# Patient Record
Sex: Male | Born: 1970 | Race: White | Hispanic: No | Smoking: Current every day smoker
Health system: Southern US, Community
[De-identification: ages and names within clinical notes are randomized; demographics above are authoritative.]

## PROBLEM LIST (undated history)

## (undated) ENCOUNTER — Emergency Department: Admission: EM | Payer: MEDICAID | Source: Home / Self Care

## (undated) DIAGNOSIS — D696 Thrombocytopenia, unspecified: Secondary | ICD-10-CM

## (undated) DIAGNOSIS — F1010 Alcohol abuse, uncomplicated: Secondary | ICD-10-CM

## (undated) DIAGNOSIS — K760 Fatty (change of) liver, not elsewhere classified: Secondary | ICD-10-CM

## (undated) DIAGNOSIS — R569 Unspecified convulsions: Secondary | ICD-10-CM

## (undated) DIAGNOSIS — F10939 Alcohol use, unspecified with withdrawal, unspecified: Secondary | ICD-10-CM

## (undated) DIAGNOSIS — K709 Alcoholic liver disease, unspecified: Secondary | ICD-10-CM

## (undated) DIAGNOSIS — I10 Essential (primary) hypertension: Secondary | ICD-10-CM

## (undated) DIAGNOSIS — F32A Depression, unspecified: Secondary | ICD-10-CM

## (undated) DIAGNOSIS — K4090 Unilateral inguinal hernia, without obstruction or gangrene, not specified as recurrent: Secondary | ICD-10-CM

## (undated) DIAGNOSIS — K219 Gastro-esophageal reflux disease without esophagitis: Secondary | ICD-10-CM

## (undated) DIAGNOSIS — F419 Anxiety disorder, unspecified: Secondary | ICD-10-CM

## (undated) HISTORY — DX: Anxiety disorder, unspecified: F41.9

## (undated) HISTORY — DX: Depression, unspecified: F32.A

## (undated) HISTORY — DX: Essential (primary) hypertension: I10

## (undated) HISTORY — DX: Gastro-esophageal reflux disease without esophagitis: K21.9

---

## 2006-06-16 ENCOUNTER — Encounter: Admission: RE | Admit: 2006-06-16 | Discharge: 2006-06-16 | Payer: Self-pay | Admitting: Neurology

## 2006-07-23 IMAGING — CR DG CHEST 2V
2 series · 2 of 2 positions shown · non-contrast
Comparison: none

CLINICAL DATA: Wheezing, cough, smoking history.  
 CHEST X-RAY: 
 Two views of the chest show no pneumonia.  There is some peribronchial thickening which may indicate bronchitis.  The heart is within normal limits in size.

[view not recorded (1 of 2)]
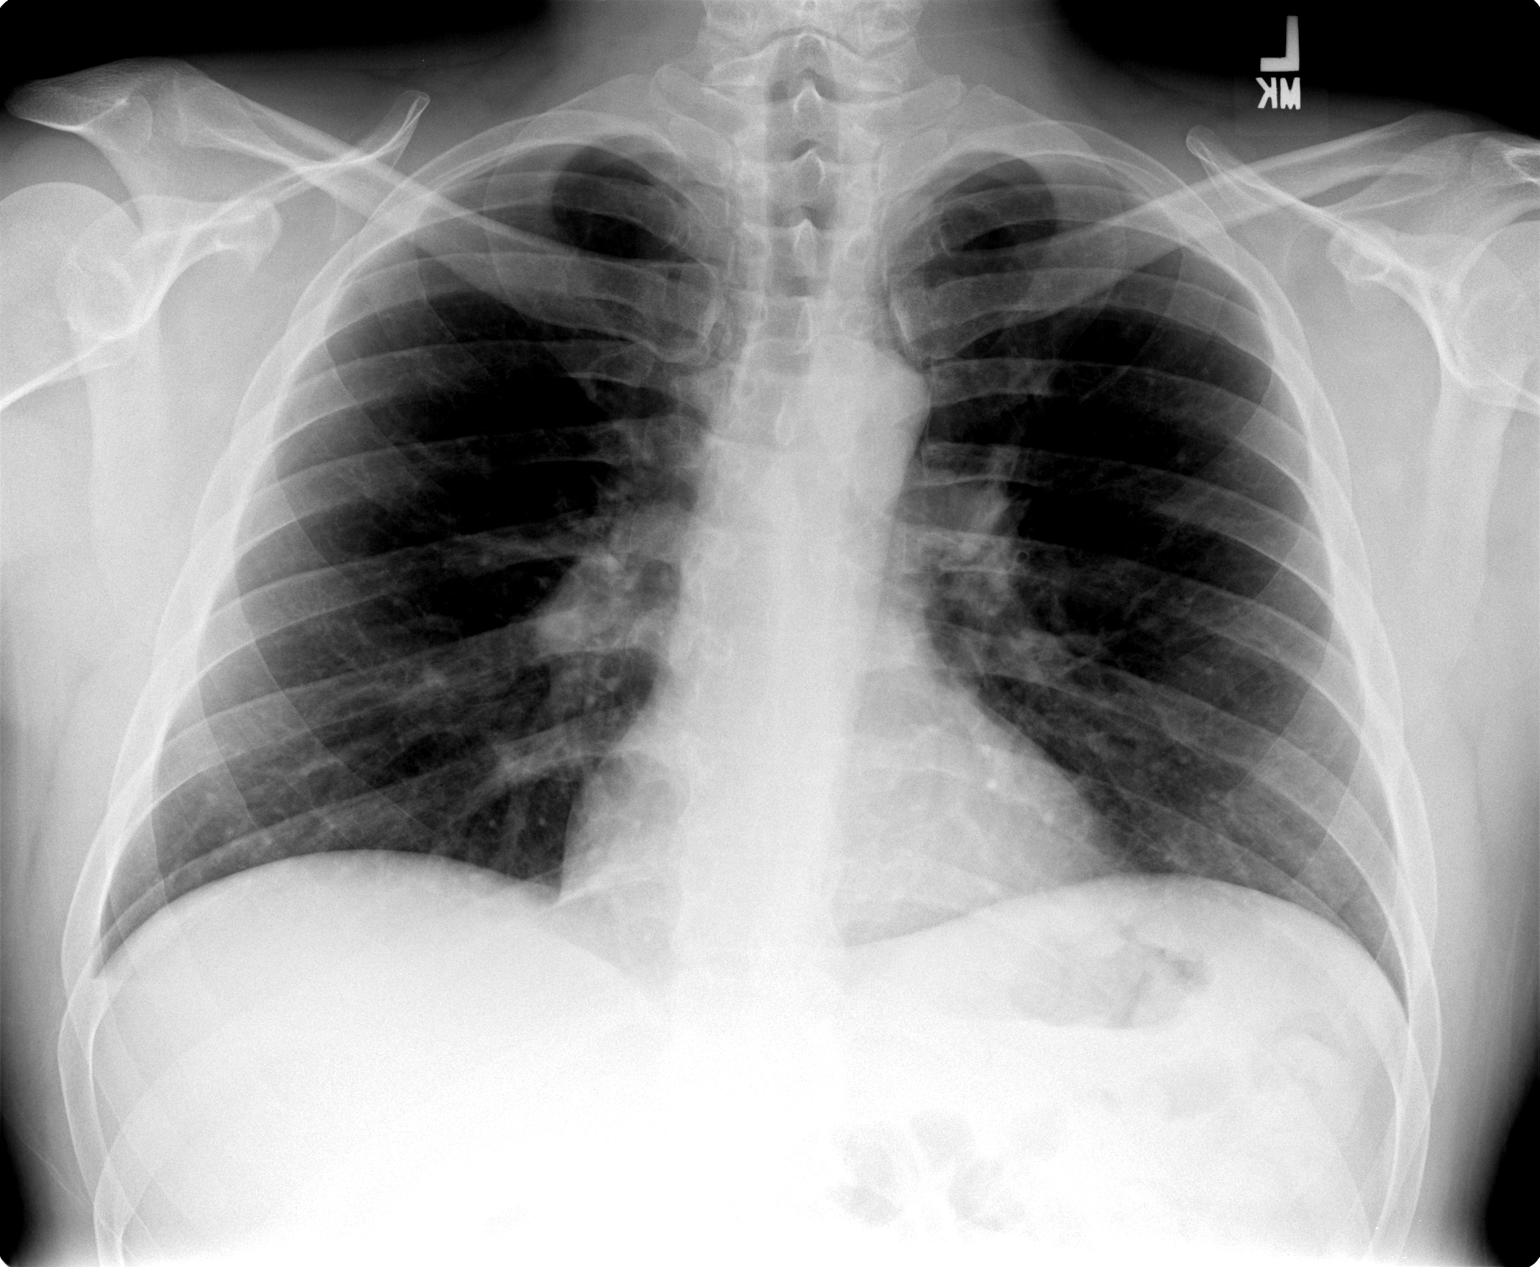

[view not recorded (2 of 2)]
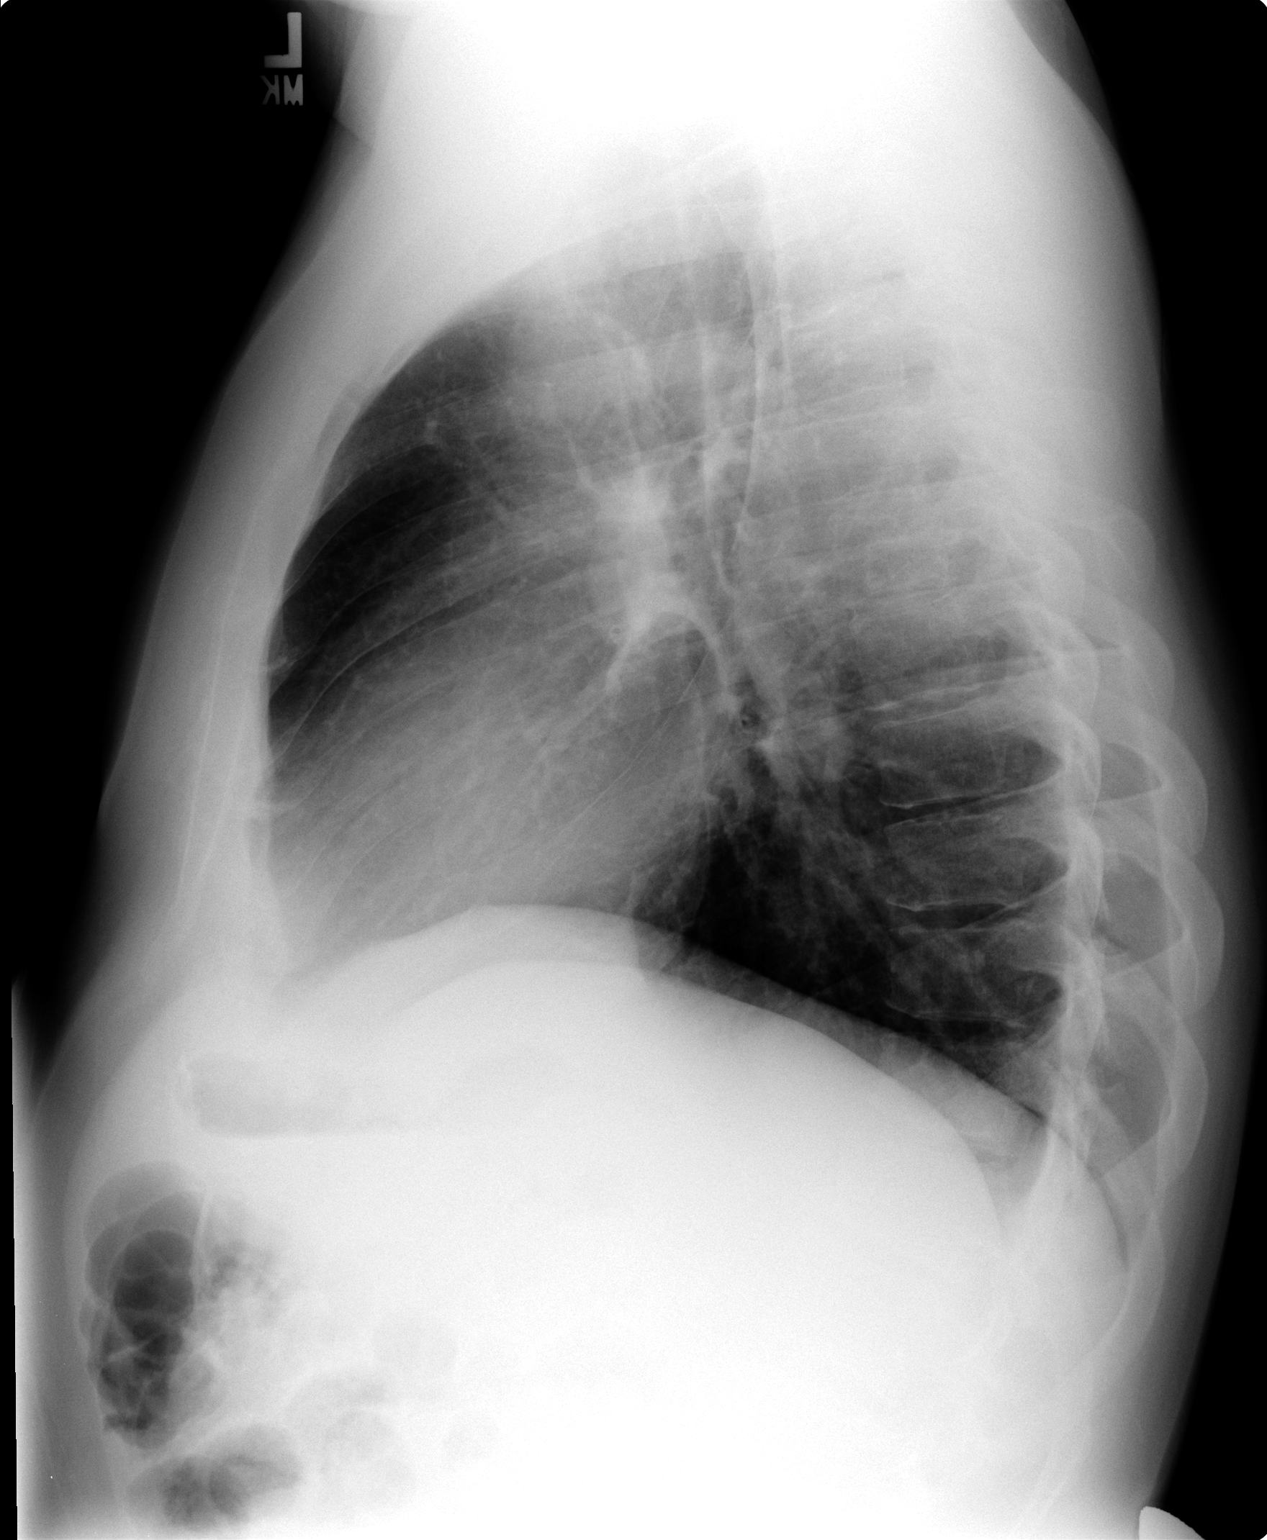

[2 of 2 positions shown; findings below may reference images not displayed]

IMPRESSION: No pneumonia.  Question bronchitis.

## 2017-03-24 HISTORY — PX: UPPER GASTROINTESTINAL ENDOSCOPY: SHX188

## 2018-12-02 HISTORY — PX: GALLBLADDER SURGERY: SHX652

## 2020-08-15 ENCOUNTER — Ambulatory Visit: Payer: Self-pay | Admitting: Gerontology

## 2020-08-15 ENCOUNTER — Encounter: Payer: Self-pay | Admitting: Gerontology

## 2020-08-15 ENCOUNTER — Other Ambulatory Visit: Payer: Self-pay

## 2020-08-15 VITALS — BP 104/71 | HR 89 | Wt 204.4 lb

## 2020-08-15 DIAGNOSIS — Z8659 Personal history of other mental and behavioral disorders: Secondary | ICD-10-CM

## 2020-08-15 DIAGNOSIS — IMO0001 Reserved for inherently not codable concepts without codable children: Secondary | ICD-10-CM

## 2020-08-15 DIAGNOSIS — Z7689 Persons encountering health services in other specified circumstances: Secondary | ICD-10-CM

## 2020-08-15 DIAGNOSIS — R1011 Right upper quadrant pain: Secondary | ICD-10-CM

## 2020-08-15 DIAGNOSIS — R109 Unspecified abdominal pain: Secondary | ICD-10-CM | POA: Insufficient documentation

## 2020-08-15 DIAGNOSIS — F17200 Nicotine dependence, unspecified, uncomplicated: Secondary | ICD-10-CM | POA: Insufficient documentation

## 2020-08-15 DIAGNOSIS — Z8719 Personal history of other diseases of the digestive system: Secondary | ICD-10-CM

## 2020-08-15 DIAGNOSIS — I10 Essential (primary) hypertension: Secondary | ICD-10-CM

## 2020-08-15 MED ORDER — SERTRALINE HCL 50 MG PO TABS: 50.0000 mg | ORAL_TABLET | Freq: Every morning | ORAL | 0 refills | Status: DC

## 2020-08-15 MED ORDER — OMEPRAZOLE 20 MG PO CPDR: 20.0000 mg | DELAYED_RELEASE_CAPSULE | Freq: Every day | ORAL | 3 refills | Status: DC

## 2020-08-15 MED ORDER — AMLODIPINE BESYLATE 5 MG PO TABS: 5.0000 mg | ORAL_TABLET | Freq: Every day | ORAL | 2 refills | Status: DC

## 2020-08-15 NOTE — Progress Notes (Signed)
Patient ID: Maurice Murray, male   DOB: 07/21/1971, 49 y.o.   MRN: [id number]  Chief Complaint  Patient presents with  . Establish Care  . Abdominal Pain    Felt after lifting in RUQ and moves lower about 1 month     HPI GARRETH Murray is a 49 y.o. male who presents to establish care and evaluation of his chronic conditions. He resides at [facility] for rehabilitation. He states that his last alcoholic beverage consumption was 07/19/2020. Currently he c/o intermittent right upper quadrant abdominal  pain that radiates to his lower abdominal quadrant. He states that it started 1-2 months ago and it worsens after he stopped drinking alcoholic beverages. He describes pain as sharp 6/10 and it lasts for less than 5 minutes before it resolves. He states that lifting objects aggravates pain.  He states that sitting down and resting relieves symptoms. He also reports that he feels a knot in his umbilical area when he lifts objects and this has been going on since his gall bladder surgery in 2019 . He also has a history of GERD and he takes otc 20 mg Omeprazole with relief. He equally reports of having a history of Anxiety and Depression and has been taking Sertraline 50 mg every month for 4 years and he has 3 tablets left. He states that his mood is good, denies suicidal nor homicidal ideation. He also has a history of hypertension and takes 10 mg Amlodipine daily, doesn't check his blood pressure and continues to adhere to DASH diet. He also states that he smokes 1/2-1 pack of cigarette daily and admits the desire to quit after getting through with his rehabiliation. Overall, he states that he's doing well and offers no further complaint.   Past Medical History:  Diagnosis Date  . Anxiety   . Depression   . GERD (gastroesophageal reflux disease)   . Hypertension       No family history on file.  Social History Social History   Tobacco Use  . Smoking status: Current Every Day Smoker    Packs/day: 0.50     Years: 30.00    Pack years: 15.00    Types: Cigarettes  . Smokeless tobacco: Never Used  Vaping Use  . Vaping Use: Never used  Substance Use Topics  . Alcohol use: Not Currently  . Drug use: Not on file    No Known Allergies  Current Outpatient Medications  Medication Sig Dispense Refill  . amLODipine (NORVASC) 5 MG tablet Take 1 tablet (5 mg total) by mouth daily. 30 tablet 2  . omeprazole (PRILOSEC) 20 MG capsule Take 1 capsule (20 mg total) by mouth daily. 30 capsule 3  . sertraline (ZOLOFT) 50 MG tablet Take 1 tablet (50 mg total) by mouth in the morning. 30 tablet 0  . Potassium (POTASSIMIN PO) Take 595 mg by mouth daily.     No current facility-administered medications for this visit.    Review of Systems Review of Systems  Constitutional: Negative.   HENT: Negative.   Eyes: Negative.   Respiratory: Negative.   Cardiovascular: Negative.   Gastrointestinal: Positive for abdominal pain.  Endocrine: Negative.   Genitourinary: Negative.   Musculoskeletal: Negative.   Skin: Negative.   Neurological: Negative.   Hematological: Negative.   Psychiatric/Behavioral: Negative.     Blood pressure 104/71, pulse 89, weight 204 lb 6.4 oz (92.7 kg), SpO2 97 %.  Physical Exam Physical Exam Constitutional:      Appearance: He is well-developed.  HENT:     Head: Normocephalic and atraumatic.     Mouth/Throat:     Comments: Deferred per covid protocol Eyes:     Extraocular Movements: Extraocular movements intact.     Pupils: Pupils are equal, round, and reactive to light.  Cardiovascular:     Rate and Rhythm: Normal rate and regular rhythm.     Heart sounds: Normal heart sounds.  Pulmonary:     Effort: Pulmonary effort is normal.     Breath sounds: Normal breath sounds.  Abdominal:     General: Abdomen is flat. Bowel sounds are normal.     Palpations: Abdomen is soft.     Tenderness: There is abdominal tenderness (to right upper quadrant with palpation) in the  right upper quadrant.  Genitourinary:    Comments: Deferred per patient Skin:    General: Skin is warm and dry.  Neurological:     General: No focal deficit present.     Mental Status: He is alert and oriented to person, place, and time.  Psychiatric:        Mood and Affect: Mood normal.        Behavior: Behavior normal.     Data Reviewed  Lab and Past medical history was reviewed.  Assessment and Plan  1. Encounter to establish care - Routine labs will be checked.  CBC w/Diff; Future - Urinalysis; Future - Lipid panel; Future - Comp Met (CMET); Future - HgB A1c; Future  2. Essential hypertension - His blood pressure was low, decreased Norvasc to 5 mg daily. He was advised to continue on DASH diet. - amLODipine (NORVASC) 5 MG tablet; Take 1 tablet (5 mg total) by mouth daily.  Dispense: 30 tablet; Refill: 2  3. History of depression - He will continue on current treatment regimen, was advised to schedule an appointment at [facility], also to call the Crisis Help line with worsening symptoms. - sertraline (ZOLOFT) 50 MG tablet; Take 1 tablet (50 mg total) by mouth in the morning.  Dispense: 30 tablet; Refill: 0  4. Smoking - He was encouraged on smoking cessation and provided with [facility] Quit line information.  5. Right upper quadrant abdominal pain - Possible Pancreatitis and liver involvement, will check labs and possible referral to Gastroenterology. He was advised to go to the ED for worsening symptoms.  CBC w/Diff; Future - Urinalysis; Future - Lipid panel; Future - Comp Met (CMET); Future - HgB A1c; Future   6. History of gastroesophageal reflux (GERD) -He will continue on current treatment regimen and advised to  -Avoid spicy, fatty and fried food -Avoid sodas and sour juices -Avoid heavy meals -Avoid eating 4 hours before bedtime -Elevate head of bed at night - omeprazole (PRILOSEC) 20 MG capsule; Take 1 capsule (20 mg total) by mouth daily.  Dispense: 30 capsule;  Refill: 3 -  Follow up: 09/06/2020 if symptoms worsen or fail to improve.  Sierah Lacewell E Ryleeann Urquiza 08/15/2020, 6:18 PM

## 2020-08-15 NOTE — Patient Instructions (Signed)
Smoking Tobacco Information, Adult Smoking tobacco can be harmful to your health. Tobacco contains a poisonous (toxic), colorless chemical called nicotine. Nicotine is addictive. It changes the brain and can make it hard to stop smoking. Tobacco also has other toxic chemicals that can hurt your body and raise your risk of many cancers. How can smoking tobacco affect me? Smoking tobacco puts you at risk for:  Cancer. Smoking is most commonly associated with lung cancer, but can also lead to cancer in other parts of the body.  Chronic obstructive pulmonary disease (COPD). This is a long-term lung condition that makes it hard to breathe. It also gets worse over time.  High blood pressure (hypertension), heart disease, stroke, or heart attack.  Lung infections, such as pneumonia.  Cataracts. This is when the lenses in the eyes become clouded.  Digestive problems. This may include peptic ulcers, heartburn, and gastroesophageal reflux disease (GERD).  Oral health problems, such as gum disease and tooth loss.  Loss of taste and smell. Smoking can affect your appearance by causing:  Wrinkles.  Yellow or stained teeth, fingers, and fingernails. Smoking tobacco can also affect your social life, because:  It may be challenging to find places to smoke when away from home. Many workplaces, restaurants, hotels, and public places are tobacco-free.  Smoking is expensive. This is due to the cost of tobacco and the long-term costs of treating health problems from smoking.  Secondhand smoke may affect those around you. Secondhand smoke can cause lung cancer, breathing problems, and heart disease. Children of smokers have a higher risk for: ? Sudden infant death syndrome (SIDS). ? Ear infections. ? Lung infections. If you currently smoke tobacco, quitting now can help you:  Lead a longer and healthier life.  Look, smell, breathe, and feel better over time.  Save money.  Protect others from the  harms of secondhand smoke. What actions can I take to prevent health problems? Quit smoking   Do not start smoking. Quit if you already do.  Make a plan to quit smoking and commit to it. Look for programs to help you and ask your health care provider for recommendations and ideas.  Set a date and write down all the reasons you want to quit.  Let your friends and family know you are quitting so they can help and support you. Consider finding friends who also want to quit. It can be easier to quit with someone else, so that you can support each other.  Talk with your health care provider about using nicotine replacement medicines to help you quit, such as gum, lozenges, patches, sprays, or pills.  Do not replace cigarette smoking with electronic cigarettes, which are commonly called e-cigarettes. The safety of e-cigarettes is not known, and some may contain harmful chemicals.  If you try to quit but return to smoking, stay positive. It is common to slip up when you first quit, so take it one day at a time.  Be prepared for cravings. When you feel the urge to smoke, chew gum or suck on hard candy. Lifestyle  Stay busy and take care of your body.  Drink enough fluid to keep your urine pale yellow.  Get plenty of exercise and eat a healthy diet. This can help prevent weight gain after quitting.  Monitor your eating habits. Quitting smoking can cause you to have a larger appetite than when you smoke.  Find ways to relax. Go out with friends or family to a movie or a restaurant   where people do not smoke.  Ask your health care provider about having regular tests (screenings) to check for cancer. This may include blood tests, imaging tests, and other tests.  Find ways to manage your stress, such as meditation, yoga, or exercise. Where to find support To get support to quit smoking, consider:  Asking your health care provider for more information and resources.  Taking classes to learn  more about quitting smoking.  Looking for local organizations that offer resources about quitting smoking.  Joining a support group for people who want to quit smoking in your local community.  Calling the smokefree.gov counselor helpline: 1-800-Quit-Now (725) 572-8849) Where to find more information You may find more information about quitting smoking from:  HelpGuide.org: www.helpguide.org  BankRights.uy: smokefree.gov  American Lung Association: www.lung.org Contact a health care provider if you:  Have problems breathing.  Notice that your lips, nose, or fingers turn blue.  Have chest pain.  Are coughing up blood.  Feel faint or you pass out.  Have other health changes that cause you to worry. Summary  Smoking tobacco can negatively affect your health, the health of those around you, your finances, and your social life.  Do not start smoking. Quit if you already do. If you need help quitting, ask your health care provider.  Think about joining a support group for people who want to quit smoking in your local community. There are many effective programs that will help you to quit this behavior. This information is not intended to replace advice given to you by your health care provider. Make sure you discuss any questions you have with your health care provider. Document Revised: 08/13/2019 Document Reviewed: 12/03/2016 Elsevier Patient Education  2020 [employer]. Food Choices for Gastroesophageal Reflux Disease, Adult When you have gastroesophageal reflux disease (GERD), the foods you eat and your eating habits are very important. Choosing the right foods can help ease your discomfort. Think about working with a nutrition specialist (dietitian) to help you make good choices. What are tips for following this plan?  Meals  Choose healthy foods that are low in fat, such as fruits, vegetables, whole grains, low-fat dairy products, and lean meat, fish, and  poultry.  Eat small meals often instead of 3 large meals a day. Eat your meals slowly, and in a place where you are relaxed. Avoid bending over or lying down until 2-3 hours after eating.  Avoid eating meals 2-3 hours before bed.  Avoid drinking a lot of liquid with meals.  Cook foods using methods other than frying. Bake, grill, or broil food instead.  Avoid or limit: ? Chocolate. ? Peppermint or spearmint. ? Alcohol. ? Pepper. ? Black and decaffeinated coffee. ? Black and decaffeinated tea. ? Bubbly (carbonated) soft drinks. ? Caffeinated energy drinks and soft drinks.  Limit high-fat foods such as: ? Fatty meat or fried foods. ? Whole milk, cream, butter, or ice cream. ? Nuts and nut butters. ? Pastries, donuts, and sweets made with butter or shortening.  Avoid foods that cause symptoms. These foods may be different for everyone. Common foods that cause symptoms include: ? Tomatoes. ? Oranges, lemons, and limes. ? Peppers. ? Spicy food. ? Onions and garlic. ? Vinegar. Lifestyle  Maintain a healthy weight. Ask your doctor what weight is healthy for you. If you need to lose weight, work with your doctor to do so safely.  Exercise for at least 30 minutes for 5 or more days each week, or as told by your  doctor.  Wear loose-fitting clothes.  Do not smoke. If you need help quitting, ask your doctor.  Sleep with the head of your bed higher than your feet. Use a wedge under the mattress or blocks under the bed frame to raise the head of the bed. Summary  When you have gastroesophageal reflux disease (GERD), food and lifestyle choices are very important in easing your symptoms.  Eat small meals often instead of 3 large meals a day. Eat your meals slowly, and in a place where you are relaxed.  Limit high-fat foods such as fatty meat or fried foods.  Avoid bending over or lying down until 2-3 hours after eating.  Avoid peppermint and spearmint, caffeine, alcohol, and  chocolate. This information is not intended to replace advice given to you by your health care provider. Make sure you discuss any questions you have with your health care provider. Document Revised: 03/11/2019 Document Reviewed: 12/24/2016 Elsevier Patient Education  2020 [employer].

## 2020-08-25 ENCOUNTER — Ambulatory Visit: Payer: Self-pay | Admitting: Pharmacy Technician

## 2020-08-25 DIAGNOSIS — Z79899 Other long term (current) drug therapy: Secondary | ICD-10-CM

## 2020-08-28 ENCOUNTER — Other Ambulatory Visit: Payer: Self-pay

## 2020-08-28 NOTE — Progress Notes (Signed)
Completed [facility] application and contract.  Patient agreed to all terms of the [facility] contract.    Patient approved to receive medication assistance at [facility] until time for re-certification in 2022, and as long as eligibility criteria continues to be met.    Provided patient with community resource material based on his particular needs.    Roselina Burgueno J. Jaloni Davoli Care Manager [facility]  

## 2020-08-30 ENCOUNTER — Other Ambulatory Visit: Payer: Self-pay | Admitting: Gerontology

## 2020-09-06 ENCOUNTER — Ambulatory Visit: Payer: Self-pay | Admitting: Gerontology

## 2020-09-06 ENCOUNTER — Other Ambulatory Visit: Payer: Self-pay

## 2020-09-13 ENCOUNTER — Ambulatory Visit: Payer: Self-pay | Admitting: Gerontology

## 2020-12-20 ENCOUNTER — Telehealth: Payer: Self-pay | Admitting: Gerontology

## 2022-05-02 ENCOUNTER — Other Ambulatory Visit: Payer: Self-pay

## 2022-05-03 ENCOUNTER — Other Ambulatory Visit: Payer: Self-pay

## 2022-05-03 MED ORDER — SERTRALINE HCL 50 MG PO TABS
50.0000 mg | ORAL_TABLET | Freq: Every day | ORAL | 9 refills | Status: DC
  Filled 2022-05-03: qty 30, 30d supply, fill #0
  Filled 2022-06-07: qty 30, 30d supply, fill #1
  Filled 2022-07-16: qty 30, 30d supply, fill #2
  Filled 2022-08-19: qty 30, 30d supply, fill #3
  Filled 2022-09-20: qty 30, 30d supply, fill #4
  Filled 2022-10-15: qty 30, 30d supply, fill #5
  Filled 2022-12-07: qty 30, 30d supply, fill #6
  Filled 2022-12-17 – 2022-12-26 (×2): qty 30, 30d supply, fill #7
  Filled 2023-02-11: qty 30, 30d supply, fill #8

## 2022-05-03 MED ORDER — AMLODIPINE BESYLATE 10 MG PO TABS
10.0000 mg | ORAL_TABLET | Freq: Every day | ORAL | 9 refills | Status: DC
  Filled 2022-05-03: qty 30, 30d supply, fill #0

## 2022-05-03 MED ORDER — ESOMEPRAZOLE MAGNESIUM 40 MG PO CPDR
40.0000 mg | DELAYED_RELEASE_CAPSULE | Freq: Every day | ORAL | 9 refills | Status: DC
  Filled 2022-05-03: qty 30, 30d supply, fill #0
  Filled 2022-06-07: qty 30, 30d supply, fill #1
  Filled 2022-07-16: qty 30, 30d supply, fill #2
  Filled 2022-09-02: qty 30, 30d supply, fill #3
  Filled 2022-10-03: qty 30, 30d supply, fill #4
  Filled 2022-10-15 – 2022-11-15 (×2): qty 30, 30d supply, fill #5
  Filled 2022-12-07: qty 30, 30d supply, fill #6
  Filled 2022-12-17 – 2022-12-26 (×2): qty 30, 30d supply, fill #7

## 2022-05-17 ENCOUNTER — Encounter: Payer: Self-pay | Admitting: Pharmacy Technician

## 2022-05-17 ENCOUNTER — Other Ambulatory Visit: Payer: Self-pay

## 2022-05-17 NOTE — Patient Outreach (Signed)
Received updated proof of income.  Patient eligible to receive medication assistance at [facility] until time for re-certification in 7227, and as long as eligibility requirements continue to be met.  Jacquelynn Cree Patient Advocate Specialist [place]

## 2022-05-23 ENCOUNTER — Ambulatory Visit: Payer: Self-pay | Admitting: Gerontology

## 2022-05-23 VITALS — BP 119/79 | HR 92 | Wt 213.9 lb

## 2022-05-23 DIAGNOSIS — R1012 Left upper quadrant pain: Secondary | ICD-10-CM

## 2022-05-23 DIAGNOSIS — Z8719 Personal history of other diseases of the digestive system: Secondary | ICD-10-CM | POA: Insufficient documentation

## 2022-05-23 DIAGNOSIS — I10 Essential (primary) hypertension: Secondary | ICD-10-CM

## 2022-05-23 DIAGNOSIS — Z0000 Encounter for general adult medical examination without abnormal findings: Secondary | ICD-10-CM

## 2022-05-23 NOTE — Patient Instructions (Signed)

## 2022-05-23 NOTE — Progress Notes (Signed)
Established Patient Office Visit  Subjective   Patient ID: Maurice Murray, male    DOB: [birth year]  Age: 51 y.o. MRN: [id number]  No chief complaint on file.   HPI  Maurice Murray is a 51 y.o. male who has history of hypertension, GERD, anxiety, presents to reestablish care and evaluation of his chronic conditions. He resides at [facility] for rehabilitation for the past 1 month.  He takes 10 mg amlodipine for his hypertension, denies side effects.  He requests dental referral for tooth filling.  He denies pain, swelling and redness.  Currently, he c/o intermittent left upper abdominal pain that has been going on after he stopped drinking alcohol 6 weeks ago.  He reports experiencing the pain daily, states that pain resolves within 1 hour after pushing in his abdomen. He reports history of umbelical hernia that gets enlarged with lifting heavy objects.  He denies nausea, vomiting and constipation.  He states that his acid reflux is under control with taking Nexium 40 mg daily.  He has a history of anxiety and takes 50 mg Zoloft, states that his mood is good, denies suicidal no homicidal ideation.  Overall, he states that he is doing well and offers no further complaint.  Review of Systems  Constitutional: Negative.   HENT: Negative.    Eyes: Negative.   Respiratory: Negative.    Cardiovascular: Negative.   Gastrointestinal:  Positive for abdominal pain.  Genitourinary: Negative.   Musculoskeletal: Negative.   Skin: Negative.   Neurological: Negative.   Endo/Heme/Allergies: Negative.   Psychiatric/Behavioral: Negative.        Objective:     BP 119/79 (BP Location: Left Arm, Patient Position: Sitting, Cuff Size: Large)   Pulse 92   Wt 213 lb 14.4 oz (97 kg)  BP Readings from Last 3 Encounters:  05/23/22 119/79  08/15/20 104/71   Wt Readings from Last 3 Encounters:  05/23/22 213 lb 14.4 oz (97 kg)  08/15/20 204 lb 6.4 oz (92.7 kg)      Physical Exam HENT:     Head: Normocephalic  and atraumatic.     Nose: Nose normal.     Mouth/Throat:     Mouth: Mucous membranes are moist.  Eyes:     Extraocular Movements: Extraocular movements intact.     Conjunctiva/sclera: Conjunctivae normal.     Pupils: Pupils are equal, round, and reactive to light.  Cardiovascular:     Rate and Rhythm: Normal rate and regular rhythm.     Pulses: Normal pulses.     Heart sounds: Normal heart sounds.  Pulmonary:     Effort: Pulmonary effort is normal.     Breath sounds: Normal breath sounds.  Abdominal:     General: Bowel sounds are normal.     Palpations: Abdomen is soft.     Tenderness: There is no abdominal tenderness.     Hernia: No hernia is present.  Musculoskeletal:        General: Normal range of motion.     Cervical back: Normal range of motion.  Skin:    General: Skin is warm.  Neurological:     General: No focal deficit present.     Mental Status: He is alert and oriented to person, place, and time.  Psychiatric:        Mood and Affect: Mood normal.        Behavior: Behavior normal.        Thought Content: Thought content normal.  Judgment: Judgment normal.      No results found for any visits on 05/23/22.  Last CBC No results found for: "WBC", "HGB", "HCT", "MCV", "MCH", "RDW", "PLT" Last metabolic panel No results found for: "GLUCOSE", "NA", "K", "CL", "CO2", "BUN", "CREATININE", "EGFR", "CALCIUM", "PHOS", "PROT", "ALBUMIN", "LABGLOB", "AGRATIO", "BILITOT", "ALKPHOS", "AST", "ALT", "ANIONGAP" Last lipids No results found for: "CHOL", "HDL", "LDLCALC", "LDLDIRECT", "TRIG", "CHOLHDL" Last hemoglobin A1c No results found for: "HGBA1C"    The ASCVD Risk score (Arnett DK, et al., 2019) failed to calculate for the following reasons:   Cannot find a previous HDL lab   Cannot find a previous total cholesterol lab    Assessment & Plan:   1. Health care maintenance -Routine labs will be checked. - Comp Met (CMET); Future - Lipid panel; Future - HgB  A1c; Future - CBC w/Diff; Future  2. Essential hypertension -His blood pressure is under control, and he will continue current medication, DASH diet and exercise as tolerated.  3. Left upper quadrant abdominal pain -He was advised to complete COVID financial application for referral to gastroenterology.  He was advised to monitor her abdominal pain and go to the emergency room with worsening symptoms.  4. History of umbilical hernia -He was encouraged to complete COVID financial application for possible referral to general surgery for evaluation.  He was advised to go to the emergency room with worsening symptoms.    Return in about 2 weeks (around 06/06/2022), or if symptoms worsen or fail to improve.    Sharika Mosquera Jerold Coombe, NP

## 2022-05-29 ENCOUNTER — Other Ambulatory Visit: Payer: Medicaid Other

## 2022-05-29 DIAGNOSIS — Z0000 Encounter for general adult medical examination without abnormal findings: Secondary | ICD-10-CM

## 2022-05-30 LAB — CBC WITH DIFFERENTIAL/PLATELET
Basophils Absolute: 0.1 10*3/uL (ref 0.0–0.2)
Basos: 1 %
EOS (ABSOLUTE): 0.4 10*3/uL (ref 0.0–0.4)
Eos: 6 %
Hematocrit: 43.2 % (ref 37.5–51.0)
Hemoglobin: 14.9 g/dL (ref 13.0–17.7)
Immature Grans (Abs): 0 10*3/uL (ref 0.0–0.1)
Immature Granulocytes: 0 %
Lymphocytes Absolute: 1.8 10*3/uL (ref 0.7–3.1)
Lymphs: 27 %
MCH: 33.4 pg — ABNORMAL HIGH (ref 26.6–33.0)
MCHC: 34.5 g/dL (ref 31.5–35.7)
MCV: 97 fL (ref 79–97)
Monocytes Absolute: 0.6 10*3/uL (ref 0.1–0.9)
Monocytes: 8 %
Neutrophils Absolute: 3.9 10*3/uL (ref 1.4–7.0)
Neutrophils: 58 %
Platelets: 181 10*3/uL (ref 150–450)
RBC: 4.46 x10E6/uL (ref 4.14–5.80)
RDW: 12.4 % (ref 11.6–15.4)
WBC: 6.7 10*3/uL (ref 3.4–10.8)

## 2022-05-30 LAB — COMPREHENSIVE METABOLIC PANEL
ALT: 14 IU/L (ref 0–44)
AST: 19 IU/L (ref 0–40)
Albumin/Globulin Ratio: 2 (ref 1.2–2.2)
Albumin: 4.5 g/dL (ref 4.0–5.0)
Alkaline Phosphatase: 67 IU/L (ref 44–121)
BUN/Creatinine Ratio: 8 — ABNORMAL LOW (ref 9–20)
BUN: 7 mg/dL (ref 6–24)
Bilirubin Total: 0.3 mg/dL (ref 0.0–1.2)
CO2: 24 mmol/L (ref 20–29)
Calcium: 9.3 mg/dL (ref 8.7–10.2)
Chloride: 101 mmol/L (ref 96–106)
Creatinine, Ser: 0.9 mg/dL (ref 0.76–1.27)
Globulin, Total: 2.3 g/dL (ref 1.5–4.5)
Glucose: 117 mg/dL — ABNORMAL HIGH (ref 70–99)
Potassium: 4.4 mmol/L (ref 3.5–5.2)
Sodium: 138 mmol/L (ref 134–144)
Total Protein: 6.8 g/dL (ref 6.0–8.5)
eGFR: 105 mL/min/{1.73_m2} (ref >59)

## 2022-05-30 LAB — LIPID PANEL
Chol/HDL Ratio: 3.6 ratio (ref 0.0–5.0)
Cholesterol, Total: 149 mg/dL (ref 100–199)
HDL: 41 mg/dL (ref >39)
LDL Chol Calc (NIH): 88 mg/dL (ref 0–99)
Triglycerides: 106 mg/dL (ref 0–149)
VLDL Cholesterol Cal: 20 mg/dL (ref 5–40)

## 2022-05-30 LAB — HEMOGLOBIN A1C
Est. average glucose Bld gHb Est-mCnc: 117 mg/dL
Hgb A1c MFr Bld: 5.7 % — ABNORMAL HIGH (ref 4.8–5.6)

## 2022-06-06 ENCOUNTER — Other Ambulatory Visit: Payer: Self-pay

## 2022-06-06 ENCOUNTER — Ambulatory Visit: Payer: Medicaid Other | Admitting: Gerontology

## 2022-06-06 ENCOUNTER — Encounter: Payer: Self-pay | Admitting: Gerontology

## 2022-06-06 VITALS — BP 114/79 | HR 83 | Temp 98.2°F | Ht 73.0 in | Wt 216.6 lb

## 2022-06-06 DIAGNOSIS — F17200 Nicotine dependence, unspecified, uncomplicated: Secondary | ICD-10-CM

## 2022-06-06 DIAGNOSIS — I10 Essential (primary) hypertension: Secondary | ICD-10-CM

## 2022-06-06 MED ORDER — AMLODIPINE BESYLATE 5 MG PO TABS
5.0000 mg | ORAL_TABLET | Freq: Every day | ORAL | 1 refills | Status: DC
  Filled 2022-06-06: qty 90, 90d supply, fill #0
  Filled 2022-09-20: qty 90, 90d supply, fill #1

## 2022-06-06 NOTE — Progress Notes (Signed)
Established Patient Office Visit  Subjective   Patient ID: Maurice Murray, male    DOB: [birth year]  Age: 51 y.o. MRN: [id number]  Chief Complaint  Patient presents with   Follow-up    Labs done 05/29/2022    HPI  Maurice Murray is a 51 y.o. male who has history of hypertension, GERD, anxiety, presents for routine visit and lab review.  His hemoglobin A1c was 5.7% and the rest of his lab was unremarkable.  He states that he is compliant with his medications and continues to make healthy lifestyle changes.  He smokes 1/2 pack and admits the desire to quit. He states that he feels tired after taking 10 mg of amlodipine daily and he requests for the dosage to be decreased to 5 mg.  His blood pressure is checked every other day at [facility] where he resides for rehabitation.  Overall, he states that he is doing well and offers no further complaint.  Review of Systems  Constitutional: Negative.   HENT: Negative.    Eyes: Negative.   Respiratory: Negative.    Cardiovascular: Negative.   Gastrointestinal: Negative.   Neurological: Negative.   Psychiatric/Behavioral: Negative.        Objective:     BP 114/79 (BP Location: Right Arm, Patient Position: Sitting, Cuff Size: Large)   Pulse 83   Temp 98.2 F (36.8 C) (Oral)   Ht 6' 1"  (1.854 m)   Wt 216 lb 9.6 oz (98.2 kg)   SpO2 96%   BMI 28.58 kg/m  BP Readings from Last 3 Encounters:  06/06/22 114/79  05/23/22 119/79  08/15/20 104/71   Wt Readings from Last 3 Encounters:  06/06/22 216 lb 9.6 oz (98.2 kg)  05/23/22 213 lb 14.4 oz (97 kg)  08/15/20 204 lb 6.4 oz (92.7 kg)      Physical Exam HENT:     Head: Normocephalic and atraumatic.     Mouth/Throat:     Mouth: Mucous membranes are moist.  Eyes:     Extraocular Movements: Extraocular movements intact.     Conjunctiva/sclera: Conjunctivae normal.     Pupils: Pupils are equal, round, and reactive to light.  Cardiovascular:     Rate and Rhythm: Normal rate and regular  rhythm.     Pulses: Normal pulses.     Heart sounds: Normal heart sounds.  Pulmonary:     Effort: Pulmonary effort is normal.     Breath sounds: Normal breath sounds.  Musculoskeletal:        General: Normal range of motion.  Neurological:     General: No focal deficit present.     Mental Status: He is alert and oriented to person, place, and time. Mental status is at baseline.  Psychiatric:        Mood and Affect: Mood normal.        Behavior: Behavior normal.        Thought Content: Thought content normal.        Judgment: Judgment normal.      No results found for any visits on 06/06/22.  Last CBC Lab Results  Component Value Date   WBC 6.7 05/29/2022   HGB 14.9 05/29/2022   HCT 43.2 05/29/2022   MCV 97 05/29/2022   MCH 33.4 (H) 05/29/2022   RDW 12.4 05/29/2022   PLT 181 15/04/6978   Last metabolic panel Lab Results  Component Value Date   GLUCOSE 117 (H) 05/29/2022   NA 138 05/29/2022   K 4.4  05/29/2022   CL 101 05/29/2022   CO2 24 05/29/2022   BUN 7 05/29/2022   CREATININE 0.90 05/29/2022   EGFR 105 05/29/2022   CALCIUM 9.3 05/29/2022   PROT 6.8 05/29/2022   ALBUMIN 4.5 05/29/2022   LABGLOB 2.3 05/29/2022   AGRATIO 2.0 05/29/2022   BILITOT 0.3 05/29/2022   ALKPHOS 67 05/29/2022   AST 19 05/29/2022   ALT 14 05/29/2022   Last lipids Lab Results  Component Value Date   CHOL 149 05/29/2022   HDL 41 05/29/2022   LDLCALC 88 05/29/2022   TRIG 106 05/29/2022   CHOLHDL 3.6 05/29/2022   Last hemoglobin A1c Lab Results  Component Value Date   HGBA1C 5.7 (H) 05/29/2022      The 10-year ASCVD risk score (Arnett DK, et al., 2019) is: 5.5%    Assessment & Plan:    1. Essential hypertension -The blood pressure is under control, his amlodipine was decreased to 5 mg daily, he was advised to check his blood pressure daily,record and bring log to follow-up appointment.  He was advised to continue on DASH diet and exercises as tolerated. - amLODipine  (NORVASC) 5 MG tablet; Take 1 tablet (5 mg total) by mouth daily.  Dispense: 90 tablet; Refill: 1  2. Smoking -He was encouraged on smoking cessation and was advised to call the [facility] quit line.   Return in about 13 weeks (around 09/05/2022), or if symptoms worsen or fail to improve.    Sabre Romberger Jerold Coombe, NP

## 2022-06-06 NOTE — Patient Instructions (Signed)

## 2022-06-07 ENCOUNTER — Other Ambulatory Visit: Payer: Self-pay

## 2022-06-18 ENCOUNTER — Other Ambulatory Visit: Payer: Self-pay

## 2022-07-16 ENCOUNTER — Other Ambulatory Visit: Payer: Self-pay

## 2022-08-19 ENCOUNTER — Other Ambulatory Visit: Payer: Self-pay

## 2022-08-22 ENCOUNTER — Other Ambulatory Visit: Payer: Self-pay

## 2022-09-02 ENCOUNTER — Other Ambulatory Visit: Payer: Self-pay

## 2022-09-05 ENCOUNTER — Ambulatory Visit: Payer: Medicaid Other | Admitting: Gerontology

## 2022-09-05 ENCOUNTER — Encounter: Payer: Self-pay | Admitting: Gerontology

## 2022-09-05 VITALS — BP 108/74 | HR 74 | Temp 97.6°F | Resp 16 | Wt 227.0 lb

## 2022-09-05 DIAGNOSIS — I10 Essential (primary) hypertension: Secondary | ICD-10-CM

## 2022-09-05 NOTE — Patient Instructions (Signed)

## 2022-09-05 NOTE — Progress Notes (Signed)
Established Patient Office Visit  Subjective   Patient ID: AZAEL RAGAIN, male    DOB: 02-02-1971  Age: 51 y.o. MRN: [id number]  Chief Complaint  Patient presents with   Follow-up    HPI Maurice Murray is a 51 y.o. male who has history of hypertension, GERD, anxiety, presents for routine visit . He states that he is compliant with his medications , denies side effects and continues to make healthy lifestyle changes.  He smokes 1/2 pack and admits the desire to quit. His blood pressure is checked every other day at [facility] where he resides for rehabitation. He states that he will complete his rehabilitation at [facility] and will be moving back to [place] next month. Overall, he states that he is doing well and offers no further complaint.   Review of Systems  Constitutional: Negative.   Eyes: Negative.   Respiratory: Negative.    Cardiovascular: Negative.   Neurological: Negative.   Psychiatric/Behavioral: Negative.        Objective:     BP 108/74 (BP Location: Right Arm, Patient Position: Sitting, Cuff Size: Large)   Pulse 74   Temp 97.6 F (36.4 C)   Resp 16   Wt 227 lb (103 kg)   SpO2 93%   BMI 29.95 kg/m  BP Readings from Last 3 Encounters:  09/05/22 108/74  06/06/22 114/79  05/23/22 119/79   Wt Readings from Last 3 Encounters:  09/05/22 227 lb (103 kg)  06/06/22 216 lb 9.6 oz (98.2 kg)  05/23/22 213 lb 14.4 oz (97 kg)      Physical Exam HENT:     Head: Normocephalic and atraumatic.     Mouth/Throat:     Mouth: Mucous membranes are moist.  Eyes:     Extraocular Movements: Extraocular movements intact.     Conjunctiva/sclera: Conjunctivae normal.     Pupils: Pupils are equal, round, and reactive to light.  Cardiovascular:     Rate and Rhythm: Normal rate and regular rhythm.     Pulses: Normal pulses.     Heart sounds: Normal heart sounds.  Pulmonary:     Effort: Pulmonary effort is normal.     Breath sounds: Normal breath sounds.  Skin:    General: Skin  is warm.  Neurological:     General: No focal deficit present.     Mental Status: He is alert and oriented to person, place, and time. Mental status is at baseline.  Psychiatric:        Mood and Affect: Mood normal.        Behavior: Behavior normal.        Thought Content: Thought content normal.        Judgment: Judgment normal.      No results found for any visits on 09/05/22.  Last CBC Lab Results  Component Value Date   WBC 6.7 05/29/2022   HGB 14.9 05/29/2022   HCT 43.2 05/29/2022   MCV 97 05/29/2022   MCH 33.4 (H) 05/29/2022   RDW 12.4 05/29/2022   PLT 181 22/29/7989   Last metabolic panel Lab Results  Component Value Date   GLUCOSE 117 (H) 05/29/2022   NA 138 05/29/2022   K 4.4 05/29/2022   CL 101 05/29/2022   CO2 24 05/29/2022   BUN 7 05/29/2022   CREATININE 0.90 05/29/2022   EGFR 105 05/29/2022   CALCIUM 9.3 05/29/2022   PROT 6.8 05/29/2022   ALBUMIN 4.5 05/29/2022   LABGLOB 2.3 05/29/2022   AGRATIO  2.0 05/29/2022   BILITOT 0.3 05/29/2022   ALKPHOS 67 05/29/2022   AST 19 05/29/2022   ALT 14 05/29/2022   Last lipids Lab Results  Component Value Date   CHOL 149 05/29/2022   HDL 41 05/29/2022   LDLCALC 88 05/29/2022   TRIG 106 05/29/2022   CHOLHDL 3.6 05/29/2022   Last hemoglobin A1c Lab Results  Component Value Date   HGBA1C 5.7 (H) 05/29/2022      The 10-year ASCVD risk score (Arnett DK, et al., 2019) is: 5%    Assessment & Plan:    1. Essential hypertension - His blood pressure is under control, he will continue on current medication, DASH diet and exercise as tolerated.   Return if symptoms worsen or fail to improve. He has no follow up appointment, [facility] wishes him well.   Shakeeta Godette Jerold Coombe, NP

## 2022-09-20 ENCOUNTER — Other Ambulatory Visit: Payer: Self-pay

## 2022-10-03 ENCOUNTER — Other Ambulatory Visit: Payer: Self-pay

## 2022-10-15 ENCOUNTER — Other Ambulatory Visit: Payer: Self-pay

## 2022-10-15 ENCOUNTER — Other Ambulatory Visit: Payer: Self-pay | Admitting: Gerontology

## 2022-10-15 DIAGNOSIS — I10 Essential (primary) hypertension: Secondary | ICD-10-CM

## 2022-11-15 ENCOUNTER — Other Ambulatory Visit: Payer: Self-pay | Admitting: Gerontology

## 2022-11-15 ENCOUNTER — Other Ambulatory Visit: Payer: Self-pay

## 2022-11-15 DIAGNOSIS — I10 Essential (primary) hypertension: Secondary | ICD-10-CM

## 2022-11-17 ENCOUNTER — Other Ambulatory Visit: Payer: Self-pay

## 2022-11-19 ENCOUNTER — Other Ambulatory Visit: Payer: Self-pay

## 2022-11-19 MED FILL — Amlodipine Besylate Tab 5 MG (Base Equivalent): ORAL | 30 days supply | Qty: 30 | Fill #0 | Status: CN

## 2022-11-19 NOTE — Telephone Encounter (Signed)
Patient was supposed to move back to [place], [place] per 09/2022 office note. If not, he will need OV prior to additional refills.

## 2022-11-21 ENCOUNTER — Other Ambulatory Visit: Payer: Self-pay

## 2022-11-22 ENCOUNTER — Other Ambulatory Visit: Payer: Self-pay

## 2022-12-10 ENCOUNTER — Other Ambulatory Visit: Payer: Self-pay

## 2022-12-18 ENCOUNTER — Other Ambulatory Visit: Payer: Self-pay

## 2022-12-26 ENCOUNTER — Other Ambulatory Visit: Payer: Self-pay

## 2023-01-06 ENCOUNTER — Other Ambulatory Visit: Payer: Self-pay

## 2023-01-08 ENCOUNTER — Ambulatory Visit
Admission: EM | Admit: 2023-01-08 | Discharge: 2023-01-08 | Disposition: A | Discharge status: Home / Self Care | Payer: Self-pay | Attending: Urgent Care | Admitting: Urgent Care

## 2023-01-08 ENCOUNTER — Other Ambulatory Visit: Payer: Self-pay

## 2023-01-08 ENCOUNTER — Encounter: Payer: Self-pay | Admitting: Emergency Medicine

## 2023-01-08 DIAGNOSIS — S058X1A Other injuries of right eye and orbit, initial encounter: Secondary | ICD-10-CM

## 2023-01-08 MED ORDER — KETOROLAC TROMETHAMINE 0.5 % OP SOLN
1.0000 [drp] | Freq: Four times a day (QID) | OPHTHALMIC | 0 refills | Status: DC
  Filled 2023-01-08: qty 5, 25d supply, fill #0

## 2023-01-08 NOTE — ED Triage Notes (Addendum)
Right eye is painful, red.  The light hurts eye.  Using "family care eye drops " in eye today.  Symptoms started around 4 am -woke patient up.  Patient wears contacts.  Removed contacts when he woke with pain.  Patient states he can see ok.  No changes.    Reports history of the same.  Removing contacts and using eye drops resolves the issue

## 2023-01-08 NOTE — ED Provider Notes (Signed)
Maurice Murray    CSN: [id number] Arrival date & time: 01/08/23  1100      History   Chief Complaint Chief Complaint  Patient presents with   Eye Problem    HPI Maurice Murray is a 52 y.o. male.    Eye Problem   Patient presents to urgent care with complaint of painful, red, right eye.  He reports photophobia.  Symptoms started about 4 AM which awakened the patient from sleep.  He reports wearing contacts and removed them when he awoke with pain.  Denies any change in vision.  Is using "family care eyedrops".  Past Medical History:  Diagnosis Date   Anxiety    Depression    GERD (gastroesophageal reflux disease)    Hypertension     Patient Active Problem List   Diagnosis Date Noted   History of umbilical hernia 63/87/5643   Encounter to establish care 08/15/2020   Essential hypertension 08/15/2020   History of depression 08/15/2020   Smoking 08/15/2020   Abdominal pain 08/15/2020    Past Surgical History:  Procedure Laterality Date   GALLBLADDER SURGERY Right 2020       Home Medications    Prior to Admission medications   Medication Sig Start Date End Date Taking? Authorizing Provider  amLODipine (NORVASC) 5 MG tablet Take 1 tablet (5 mg total) by mouth daily. 11/19/22   Iloabachie, Chioma E, NP  esomeprazole (NEXIUM) 40 MG capsule Take 1 capsule (40 mg total) by mouth daily. 03/26/22     sertraline (ZOLOFT) 50 MG tablet Take 1 tablet (50 mg total) by mouth daily. 03/26/22       Family History Family History  Problem Relation Age of Onset   Cirrhosis Mother    Crohn's disease Sister     Social History Social History   Tobacco Use   Smoking status: Every Day    Packs/day: 0.50    Years: 30.00    Total pack years: 15.00    Types: Cigarettes   Smokeless tobacco: Never  Vaping Use   Vaping Use: Never used  Substance Use Topics   Alcohol use: Not Currently    Comment: last drank 04/15/2022, unable to list amounts, but "a lot of different  things in large amounts"   Drug use: Never     Allergies   Patient has no known allergies.   Review of Systems Review of Systems   Physical Exam Triage Vital Signs ED Triage Vitals [01/08/23 1149]  Enc Vitals Group     BP      Pulse      Resp      Temp      Temp src      SpO2      Weight      Height      Head Circumference      Peak Flow      Pain Score 8     Pain Loc      Pain Edu?      Excl. in [place]?    No data found.  Updated Vital Signs There were no vitals taken for this visit.  Visual Acuity Right Eye Distance:   Left Eye Distance:   Bilateral Distance:    Right Eye Near:   Left Eye Near:    Bilateral Near:     Physical Exam Vitals reviewed.  Constitutional:      Appearance: Normal appearance.  Eyes:     Conjunctiva/sclera:     Right  eye: Right conjunctiva is injected. No exudate.    Pupils:     Right eye: Corneal abrasion and fluorescein uptake present.   Neurological:     Mental Status: He is alert.      UC Treatments / Results  Labs (all labs ordered are listed, but only abnormal results are displayed) Labs Reviewed - No data to display  EKG   Radiology No results found.  Procedures Procedures (including critical care time)  Medications Ordered in UC Medications - No data to display  Initial Impression / Assessment and Plan / UC Course  I have reviewed the triage vital signs and the nursing notes.  Pertinent labs & imaging results that were available during my care of the patient were reviewed by me and considered in my medical decision making (see chart for details).   Abrasion to L cornea/sclera is noted after fluorescein exam. Some relief with admin of tetracaine drop in clinic. Discharged with NSAID drops and instructed to be evaluated by his eye provider if symptoms do not resolve.   Final Clinical Impressions(s) / UC Diagnoses   Final diagnoses:  None   Discharge Instructions   None    ED Prescriptions    None    PDMP not reviewed this encounter.   Rose Phi, [place] 01/08/23 1216

## 2023-01-08 NOTE — Discharge Instructions (Signed)
Follow up with evaluation by eye provider if your symptoms are worsening or not improving.

## 2023-01-15 ENCOUNTER — Other Ambulatory Visit: Payer: Self-pay

## 2023-01-15 ENCOUNTER — Encounter: Payer: Self-pay | Admitting: Gerontology

## 2023-01-15 ENCOUNTER — Ambulatory Visit: Payer: Medicaid Other | Admitting: Gerontology

## 2023-01-15 VITALS — BP 116/80 | HR 65 | Wt 228.0 lb

## 2023-01-15 DIAGNOSIS — L600 Ingrowing nail: Secondary | ICD-10-CM | POA: Insufficient documentation

## 2023-01-15 DIAGNOSIS — Z8719 Personal history of other diseases of the digestive system: Secondary | ICD-10-CM

## 2023-01-15 DIAGNOSIS — L729 Follicular cyst of the skin and subcutaneous tissue, unspecified: Secondary | ICD-10-CM

## 2023-01-15 DIAGNOSIS — I10 Essential (primary) hypertension: Secondary | ICD-10-CM

## 2023-01-15 HISTORY — DX: Follicular cyst of the skin and subcutaneous tissue, unspecified: L72.9

## 2023-01-15 MED ORDER — AMLODIPINE BESYLATE 5 MG PO TABS
5.0000 mg | ORAL_TABLET | Freq: Every day | ORAL | 1 refills | Status: DC
  Filled 2023-01-15: qty 90, 90d supply, fill #0
  Filled 2023-04-08 – 2023-05-06 (×2): qty 90, 90d supply, fill #1

## 2023-01-15 MED ORDER — ESOMEPRAZOLE MAGNESIUM 40 MG PO CPDR
40.0000 mg | DELAYED_RELEASE_CAPSULE | Freq: Every day | ORAL | 1 refills | Status: DC
  Filled 2023-01-15: qty 90, 90d supply, fill #0
  Filled 2023-02-11: qty 30, 30d supply, fill #0
  Filled 2023-04-08: qty 30, 30d supply, fill #1
  Filled 2023-05-06: qty 30, 30d supply, fill #2
  Filled 2023-06-03: qty 30, 30d supply, fill #3
  Filled 2023-07-16: qty 30, 30d supply, fill #4
  Filled 2023-08-21 – 2023-09-02 (×2): qty 30, 30d supply, fill #5

## 2023-01-15 NOTE — Progress Notes (Signed)
Established Patient Office Visit  Subjective   Patient ID: Maurice Murray, male    DOB: 10/28/1971  Age: 52 y.o. MRN: [id number]  Chief Complaint  Patient presents with   bump on neck   Medication Refill   Dental Problem    Medication Refill  HPI Maurice Murray is a 52 y.o. male who has history of hypertension, GERD, anxiety, presents for routine visit. He was recently seen at the ED for an abrasion of his right eye. His symptoms have resolved. He has a motile , non pruritic , non painful cyst to the lateral aspect of his left neck . He states that it "irritates him". It has been there for about a year or more and the size has increased.  It has increased in size over time. He denies places anywhere else on his body. He also requests a dental referral. He feels that he has a cavity and wants it to be checked. He states that he has pain on the medial sides of both feet. He stands a lot at work and also has ingrown toenails which bothers him. He denies pain, discharge and erythema to the bilateral ingrown toe nails. He states that his mood is good, denies suicidal nor homicidal ideation. Overall,  he states that he's doing well and offers no further complaint.  Review of Systems  Constitutional: Negative.   HENT: Negative.    Eyes: Negative.   Respiratory: Negative.    Cardiovascular: Negative.   Gastrointestinal: Negative.   Genitourinary: Negative.   Musculoskeletal:  Positive for joint pain.       Bilateral medial feet near toes  Skin:        Bump on left neck under jawline  Neurological: Negative.   Endo/Heme/Allergies: Negative.   Psychiatric/Behavioral: Negative.        Objective:     BP 116/80   Pulse 65   Wt 228 lb (103.4 kg)   SpO2 93%   BMI 30.08 kg/m  BP Readings from Last 3 Encounters:  01/15/23 116/80  01/08/23 130/87  09/05/22 108/74   Wt Readings from Last 3 Encounters:  01/15/23 228 lb (103.4 kg)  09/05/22 227 lb (103 kg)  06/06/22 216 lb 9.6 oz (98.2 kg)       Physical Exam Constitutional:      Appearance: Normal appearance. He is normal weight.  HENT:     Head: Normocephalic.  Eyes:     Pupils: Pupils are equal, round, and reactive to light.  Cardiovascular:     Rate and Rhythm: Normal rate and regular rhythm.     Pulses: Normal pulses.     Heart sounds: Normal heart sounds.  Pulmonary:     Effort: Pulmonary effort is normal.     Breath sounds: Normal breath sounds.  Abdominal:     General: Abdomen is flat. Bowel sounds are normal.  Musculoskeletal:        General: Normal range of motion.     Cervical back: Normal range of motion.     Right foot: Bunion and bony tenderness present.     Left foot: Bunion and bony tenderness present.  Skin:    General: Skin is warm and dry.     Capillary Refill: Capillary refill takes less than 2 seconds.     Findings: Lesion (raised, pale, mobile, nontender cyst on left neck under jawline.) present.  Neurological:     General: No focal deficit present.     Mental Status: He is alert  and oriented to person, place, and time. Mental status is at baseline.  Psychiatric:        Mood and Affect: Mood normal.        Behavior: Behavior normal.        Thought Content: Thought content normal.        Judgment: Judgment normal.     The 10-year ASCVD risk score (Arnett DK, et al., 2019) is: 5.7%    Assessment & Plan:  1. Essential hypertension - His blood pressure is controlled today. - He will continue on his current medication. - He was encouraged to follow DASH diet and exercise as tolerated. - amLODipine (NORVASC) 5 MG tablet; Take 1 tablet (5 mg total) by mouth daily.  Dispense: 90 tablet; Refill: 1  2. Ingrown nail of great toe - He was encouraged to wear well-fitting shoes.  - He was referred to podiatry. - Ambulatory referral to Podiatry  3. Cyst of skin - He was referred to General Surgery - Ambulatory referral to General Surgery  4. History of gastroesophageal reflux (GERD) -  He was encouraged to continue taking his medication. - He was encouraged to sit up right when eating and to avoid spicy foods. - esomeprazole (NEXIUM) 40 MG capsule; Take 1 capsule (40 mg total) by mouth daily.  Dispense: 90 capsule; Refill: 1   He will follow up in the clinic in 2 months, 03/18/23, or sooner if he worsens or fails to improve.  Eloise Harman, FNP Student

## 2023-01-15 NOTE — Patient Instructions (Signed)

## 2023-01-22 ENCOUNTER — Other Ambulatory Visit (HOSPITAL_COMMUNITY): Payer: Self-pay

## 2023-01-30 ENCOUNTER — Ambulatory Visit (INDEPENDENT_AMBULATORY_CARE_PROVIDER_SITE_OTHER): Payer: Self-pay | Admitting: Surgery

## 2023-01-30 ENCOUNTER — Encounter: Payer: Self-pay | Admitting: Surgery

## 2023-01-30 ENCOUNTER — Other Ambulatory Visit: Payer: Self-pay

## 2023-01-30 VITALS — BP 113/80 | HR 70 | Temp 97.8°F | Ht 73.0 in | Wt 223.0 lb

## 2023-01-30 DIAGNOSIS — R221 Localized swelling, mass and lump, neck: Secondary | ICD-10-CM

## 2023-01-30 DIAGNOSIS — L723 Sebaceous cyst: Secondary | ICD-10-CM

## 2023-01-30 HISTORY — PX: NECK SURGERY: SHX720

## 2023-01-30 NOTE — Progress Notes (Signed)
Patient ID: Maurice Murray, male   DOB: 01-09-1971, 52 y.o.   MRN: [id number]  Chief Complaint: Dermal cyst left neck  History of Present Illness Maurice Murray is a 52 y.o. male with a dermal cyst in his left neck.  Progressive in size, no recent drainage, never I indeed, no prior excision.  Past Medical History Past Medical History:  Diagnosis Date   Anxiety    Depression    GERD (gastroesophageal reflux disease)    Hypertension       Past Surgical History:  Procedure Laterality Date   GALLBLADDER SURGERY Right 2020    No Known Allergies  Current Outpatient Medications  Medication Sig Dispense Refill   amLODipine (NORVASC) 5 MG tablet Take 1 tablet (5 mg total) by mouth daily. 90 tablet 1   esomeprazole (NEXIUM) 40 MG capsule Take 1 capsule (40 mg total) by mouth daily. 90 capsule 1   sertraline (ZOLOFT) 50 MG tablet Take 1 tablet (50 mg total) by mouth daily. 30 tablet 9   No current facility-administered medications for this visit.    Family History Family History  Problem Relation Age of Onset   Cirrhosis Mother    Crohn's disease Sister       Social History Social History   Tobacco Use   Smoking status: Every Day    Packs/day: 0.50    Years: 30.00    Total pack years: 15.00    Types: Cigarettes   Smokeless tobacco: Never  Vaping Use   Vaping Use: Never used  Substance Use Topics   Alcohol use: Not Currently    Comment: last drank 04/15/2022, unable to list amounts, but "a lot of different things in large amounts"   Drug use: Never        Review of Systems  Constitutional: Negative.   HENT: Negative.    Eyes: Negative.   Respiratory: Negative.    Cardiovascular: Negative.   Gastrointestinal:  Positive for heartburn.  Genitourinary: Negative.   Skin: Negative.   Neurological: Negative.   Psychiatric/Behavioral: Negative.       Physical Exam Blood pressure 113/80, pulse 70, temperature 97.8 F (36.6 C), temperature source Oral, height '6\' 1"'$   (1.854 m), weight 223 lb (101.2 kg), SpO2 96 %. Last Weight  Most recent update: 01/30/2023 10:35 AM    Weight  101.2 kg (223 lb)             CONSTITUTIONAL: Well developed, and nourished, appropriately responsive and aware without distress.   EYES: Sclera non-icteric.   EARS, NOSE, MOUTH AND THROAT:  The oropharynx is clear. Oral mucosa is pink and moist.    Hearing is intact to voice.  NECK: Trachea is midline, and there is no jugular venous distension.  There is a well-defined 2.1 cm mass in the left neck, with overlying punctum involving the skin x 2.  No remarkable erythema, nontender, somewhat prominent. LYMPH NODES:  Lymph nodes in the neck are not appreciated. RESPIRATORY:   Normal respiratory effort without pathologic use of accessory muscles. CARDIOVASCULAR:  Well perfused.  GI: The abdomen is  soft, nontender, and nondistended.  MUSCULOSKELETAL:  Symmetrical muscle tone appreciated in all four extremities.    SKIN: Skin turgor is normal. No pathologic skin lesions appreciated.  NEUROLOGIC:  Motor and sensation appear grossly normal.  Cranial nerves are grossly without defect. PSYCH:  Alert and oriented to person, place and time. Affect is appropriate for situation.  Data Reviewed I have personally reviewed what is currently  available of the patient's imaging, recent labs and medical records.   Labs:     Latest Ref Rng & Units 05/29/2022    9:44 AM  CBC  WBC 3.4 - 10.8 x10E3/uL 6.7   Hemoglobin 13.0 - 17.7 g/dL 14.9   Hematocrit 37.5 - 51.0 % 43.2   Platelets 150 - 450 x10E3/uL 181       Latest Ref Rng & Units 05/29/2022    9:44 AM  CMP  Glucose 70 - 99 mg/dL 117   BUN 6 - 24 mg/dL 7   Creatinine 0.76 - 1.27 mg/dL 0.90   Sodium 134 - 144 mmol/L 138   Potassium 3.5 - 5.2 mmol/L 4.4   Chloride 96 - 106 mmol/L 101   CO2 20 - 29 mmol/L 24   Calcium 8.7 - 10.2 mg/dL 9.3   Total Protein 6.0 - 8.5 g/dL 6.8   Total Bilirubin 0.0 - 1.2 mg/dL 0.3   Alkaline Phos 44 -  121 IU/L 67   AST 0 - 40 IU/L 19   ALT 0 - 44 IU/L 14       Imaging: Radiological images reviewed:   Within last 24 hrs: No results found.  Assessment    Inflamed sebaceous cyst left neck. Patient Active Problem List   Diagnosis Date Noted   Ingrown nail of great toe 01/15/2023   Cyst of skin 01/15/2023   History of umbilical hernia [id number]   Encounter to establish care 08/15/2020   Essential hypertension 08/15/2020   History of depression 08/15/2020   Smoking 08/15/2020   Abdominal pain 08/15/2020    Plan    Excision of 2.1 cm inflamed sebaceous cyst left neck.  Pre-operative Diagnosis: 2.1 cm inflamed sebaceous cyst left neck.  Post-operative Diagnosis: same.   Surgeon: Ronny Bacon, M.D., FACS  Anesthesia: Local   Findings: As expected, complete cyst extending deep in the subcutaneous tissues.  Estimated Blood Loss: 3 mL         Specimens: Dermal cyst with overlying skin, sent for permanent section just to ensure no additional pathology.          Complications: none              Procedure Details  The patient was evaluated, the benefits, complications, treatment options, and expected outcomes were discussed with the patient. The risks of bleeding, infection, recurrence of symptoms, failure to resolve symptoms, unanticipated injury, any of which could require further surgery were reviewed with the patient. The likelihood of improving the patient's symptoms with return to their baseline status is expected.  The patient and/or family concurred with the proposed plan, giving informed consent.  The patient was taken to our procedure room, identified and the procedure verified.    The patient was positioned in the supine position and the left neck was prepped with  Chloraprep and draped in the sterile fashion.  A Time Out was held and the above information confirmed.  Local infiltration of 1% lidocaine with epi is applied to the adjacent subcutaneous tissues  and dermis.  Elliptical incision is made to excise the 2 punctum and the underlying cyst.  The cyst was excised utilizing sharp dissection a 15 blade, removing it completely intact from the underlying soft tissues.  Adequate hemostasis obtained with pressure.  Incision was then closed with running 4-0 Monocryl subcuticular.  It was sealed with Dermabond. Dermabond instructions given.  Face-to-face time spent with the patient and accompanying care providers(if present) was 35 minutes, with more than  50% of the time spent counseling, educating, and coordinating care of the patient.    These notes generated with voice recognition software. I apologize for typographical errors.  Ronny Bacon M.D., FACS 01/30/2023, 10:39 AM

## 2023-01-30 NOTE — Patient Instructions (Addendum)
We have removed a Cyst in our office today.  You have sutures under the skin that will dissolve and also dermabond (skin glue) on top of your skin which will come off on it's own in 10-14 days.  You may use Ibuprofen or Tylenol as needed for pain control. Use the ice pack 3-4 times a day for the next two days for any achiness.  You may shower 24 hours do not scrub at the area.  Avoid Strenuous activities that will make you sweat during the next 48 hours to avoid the glue coming off prematurely. Avoid activities that will place pressure to this area of the body for 1-2 weeks to avoid re-injury to incision site.  Please see your follow-up appointment provided. We will see you back in office to make sure this area is healed and to review the final pathology. If you have any questions or concerns prior to this appointment, call our office and speak with a nurse.    Excision of Skin Cysts or Lesions Excision of a skin lesion refers to the removal of a section of skin by making small cuts (incisions) in the skin. This procedure may be done to remove a cancerous (malignant) or noncancerous (benign) growth on the skin. It is typically done to treat or prevent cancer or infection. It may also be done to improve cosmetic appearance. The procedure may be done to remove: Cancerous growths, such as basal cell carcinoma, squamous cell carcinoma, or melanoma. Noncancerous growths, such as a cyst or lipoma. Growths, such as moles or skin tags, which may be removed for cosmetic reasons.  Various excision or surgical techniques may be used depending on your condition, the location of the lesion, and your overall health. Tell a health care provider about: Any allergies you have. All medicines you are taking, including vitamins, herbs, eye drops, creams, and over-the-counter medicines. Any problems you or family members have had with anesthetic medicines. Any blood disorders you have. Any surgeries you have  had. Any medical conditions you have. Whether you are pregnant or may be pregnant. What are the risks? Generally, this is a safe procedure. However, problems may occur, including: Bleeding. Infection. Scarring. Recurrence of the cyst, lipoma, or cancer. Changes in skin sensation or appearance, such as discoloration or swelling. Reaction to the anesthetics. Allergic reaction to surgical materials or ointments. Damage to nerves, blood vessels, muscles, or other structures. Continued pain.  What happens before the procedure? Ask your health care provider about: Changing or stopping your regular medicines. This is especially important if you are taking diabetes medicines or blood thinners. Taking medicines such as aspirin and ibuprofen. These medicines can thin your blood. Do not take these medicines before your procedure if your health care provider instructs you not to. You may be asked to take certain medicines. You may be asked to stop smoking. You may have an exam or testing. What happens during the procedure? To reduce your risk of infection: Your health care team will wash or sanitize their hands. Your skin will be washed with soap. You will be given a medicine to numb the area (local anesthetic). One of the following excision techniques will be performed. At the end of any of these procedures, antibiotic ointment will be applied as needed. Each of the following techniques may vary among health care providers and hospitals. Complete Surgical Excision The area of skin that needs to be removed will be marked with a pen. Using a small scalpel or scissors, the  surgeon will gently cut around and under the lesion until it is completely removed. The lesion will be placed in a fluid and sent to the lab for examination. If necessary, bleeding will be controlled with a device that delivers heat (electrocautery). The edges of the wound may be stitched (sutured) together, and a bandage  (dressing) will be applied. This procedure may be performed to treat a cancerous growth or a noncancerous cyst or lesion. Excision of a Cyst The surgeon will make an incision on the cyst. The entire cyst will be removed through the incision. The incision may be closed with sutures.  What happens after the procedure? Return to your normal activities as told by your health care provider.

## 2023-02-06 ENCOUNTER — Ambulatory Visit (INDEPENDENT_AMBULATORY_CARE_PROVIDER_SITE_OTHER): Payer: Self-pay | Admitting: Physician Assistant

## 2023-02-06 ENCOUNTER — Encounter: Payer: Self-pay | Admitting: Physician Assistant

## 2023-02-06 VITALS — BP 107/72 | HR 71 | Temp 98.3°F | Ht 73.0 in | Wt 224.0 lb

## 2023-02-06 DIAGNOSIS — R221 Localized swelling, mass and lump, neck: Secondary | ICD-10-CM

## 2023-02-06 DIAGNOSIS — L723 Sebaceous cyst: Secondary | ICD-10-CM

## 2023-02-06 DIAGNOSIS — Z09 Encounter for follow-up examination after completed treatment for conditions other than malignant neoplasm: Secondary | ICD-10-CM

## 2023-02-06 NOTE — Progress Notes (Signed)
[facility] POST-OP OFFICE VISIT  02/07/2023  HPI: Maurice Murray is a 52 y.o. male 7 days s/p excision of inflamed sebaceous cyst to the left neck with Dr Christian Mate   He is doing well Did notice slight swelling to the incision  No fever, chills, drainage Pain is well controlled No other complaints   Vital signs: BP 107/72   Pulse 71   Temp 98.3 F (36.8 C)   Ht 6\' 1"  (1.854 m)   Wt 224 lb (101.6 kg)   SpO2 98%   BMI 29.55 kg/m    Physical Exam: Constitutional: Well appearing male, NAD Skin: Incision to the left neck, CDI, no drianage, no erythema. The tissue is indruated as expected. I do not think there is any underlying fluctuance. No evidence of seroma, hematoma, no abscess.   Assessment/Plan: This is a 52 y.o. male 7 days s/p excision of inflamed sebaceous cyst to the left neck with Dr Christian Mate    - Pain control prn  - Reviewed wound care recommendation  - Reviewed surgical pathology; Benign EIC  - He can follow up on as needed basis; He understands to call with questions/concerns  -- Edison Simon, PA-C [facility] 02/07/2023, 8:48 AM M-F: 7am - 4pm

## 2023-02-11 ENCOUNTER — Other Ambulatory Visit: Payer: Self-pay

## 2023-03-19 ENCOUNTER — Other Ambulatory Visit: Payer: Self-pay

## 2023-03-19 ENCOUNTER — Ambulatory Visit: Payer: Medicaid Other | Admitting: Gerontology

## 2023-03-19 ENCOUNTER — Encounter: Payer: Self-pay | Admitting: Gerontology

## 2023-03-19 VITALS — BP 112/74 | HR 75 | Temp 98.6°F | Resp 16 | Ht 73.0 in | Wt 222.5 lb

## 2023-03-19 DIAGNOSIS — R202 Paresthesia of skin: Secondary | ICD-10-CM

## 2023-03-19 DIAGNOSIS — I10 Essential (primary) hypertension: Secondary | ICD-10-CM

## 2023-03-19 DIAGNOSIS — Z0000 Encounter for general adult medical examination without abnormal findings: Secondary | ICD-10-CM

## 2023-03-19 DIAGNOSIS — Z8659 Personal history of other mental and behavioral disorders: Secondary | ICD-10-CM

## 2023-03-19 MED ORDER — SERTRALINE HCL 50 MG PO TABS
50.0000 mg | ORAL_TABLET | Freq: Every day | ORAL | 0 refills | Status: DC
  Filled 2023-03-19: qty 30, 30d supply, fill #0

## 2023-03-19 NOTE — Progress Notes (Signed)
Established Patient Office Visit  Subjective   Patient ID: Maurice Murray, male    DOB: [birth year]  Age: 52 y.o. MRN: [id number]  Chief Complaint  Patient presents with   Follow-up   Hypertension    HPI Maurice Murray is a 52 y.o. male with history of hypertension, GERD, anxiety, alcohol use who presents today for follow up of hypertension. He was seen 01/30/23 by general surgery for I&D of 2.1 cm benign epidermal inclusion cyst on the left neck. He followed up 1 week later for post-op evaluation, was advised to follow up as needed.  He takes his amlodipine daily for hypertension, denies side effects. He denies headache, blurry vision, chest pain, palpitations. He knows he should follow DASH diet guidelines and get exercise as tolerated, aiming for 150 minutes per week. He is still smoking 1/2 ppd, has no desire to quit at this time.  He has not yet followed up with podiatry for ingrown toenails, stating he was unable to write down the number when they called to schedule. He reports ongoing stinging, tingling in his toes on both feet. He thinks this is from either the ingrown toenails (bilateral great toes) or from long hours working on his feet. 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 Denies ulcers, lesions, rash. He sees occasional redness on his feet at the end of the day.   He denies depression, SI/HI. He has been taking zoloft 50 mg for "years" and feels it works well to control his symptoms. Resides at [facility] until May 18, 2023. Plans to stay in the area after he is done. Overall, he states he is feeling well and offers no complaint.   Patient Active Problem List   Diagnosis Date Noted   Ingrown nail of great toe 01/15/2023   Cyst of skin 01/15/2023   History of umbilical hernia 05/23/2022   Encounter to establish care 08/15/2020   Essential hypertension 08/15/2020   History of depression 08/15/2020   Smoking 08/15/2020   Abdominal pain 08/15/2020    Past Medical History:  Diagnosis Date   Anxiety    Depression    GERD (gastroesophageal reflux disease)    Hypertension    Past Surgical History:  Procedure Laterality Date   GALLBLADDER SURGERY Right 2020   NECK SURGERY Left 01/30/2023   cyst removal   Social History   Tobacco Use   Smoking status: Every Day    Packs/day: 0.50    Years: 30.00    Additional pack years: 0.00    Total pack years: 15.00    Types: Cigarettes   Smokeless tobacco: Current   Tobacco comments:    Patient dips occassionally  Vaping Use   Vaping Use: Never used  Substance Use Topics   Alcohol use: Not Currently    Comment: last drank 11/16/2022, unable to list amounts, but "a lot of different things in large amounts"   Drug use: Never   Family History  Problem Relation Age of Onset   Alcohol abuse Mother    Cirrhosis Mother    Healthy Father    Crohn's disease Sister    Healthy Daughter    Alcohol abuse Maternal Grandmother    Hypertension Maternal Grandfather    Heart disease Maternal Grandfather    Lung cancer Paternal Grandmother        small cell   Hypertension Paternal Grandfather    Heart attack Paternal Grandfather 61   No Known Allergies  Review of Systems  Constitutional:  Negative for chills, fever, malaise/fatigue and weight loss.  Eyes:  Negative for blurred vision.  Respiratory:  Negative for cough.   Cardiovascular:  Negative for chest pain, palpitations and leg swelling.  Gastrointestinal:  Negative for constipation, diarrhea, heartburn, nausea and vomiting.  Genitourinary:  Negative for frequency, hematuria and urgency.  Neurological:  Positive for tingling (tingling in both feet, toes). Negative for dizziness, weakness and headaches.  Psychiatric/Behavioral:  Negative for depression, substance abuse (Last used 11/16/22) and suicidal ideas.       Objective:     BP 112/74 (BP Location: Right Arm, Patient Position: Sitting, Cuff Size: Large)   Pulse 75    Temp 98.6 F (37 C) (Oral)   Resp 16   Ht  (1.854 m)   Wt 222 lb 8 oz (100.9 kg)   SpO2 95%   BMI 29.36 kg/m     Physical Exam Vitals and nursing note reviewed. Exam conducted with a chaperone present.  Constitutional:      General: He is not in acute distress.    Appearance: Normal appearance.  Cardiovascular:     Rate and Rhythm: Normal rate and regular rhythm.     Pulses: Normal pulses.     Heart sounds: Normal heart sounds. No murmur heard. Pulmonary:     Effort: Pulmonary effort is normal. No respiratory distress.     Breath sounds: Normal breath sounds.  Musculoskeletal:        General: Tenderness (bilat great toes) present. No swelling.     Right lower leg: No edema.     Left lower leg: No edema.  Skin:    General: Skin is warm and dry.     Capillary Refill: Capillary refill takes less than 2 seconds.     Coloration: Skin is not jaundiced or pale.     Findings: No bruising, erythema, lesion or rash.  Neurological:     General: No focal deficit present.     Mental Status: He is alert and oriented to person, place, and time.     Sensory: Sensory deficit (diminished sensation great and 2nd toes bilaterally) present.     Motor: No weakness.     Gait: Gait normal.  Psychiatric:        Mood and Affect: Mood normal.        Behavior: Behavior normal.        Thought Content: Thought content normal.        Judgment: Judgment normal.      No results found for any visits on 03/19/23.  Last CBC Lab Results  Component Value Date   WBC 6.7 05/29/2022   HGB 14.9 05/29/2022   HCT 43.2 05/29/2022   MCV 97 05/29/2022   MCH 33.4 (H) 05/29/2022   RDW 12.4 05/29/2022   PLT 181 05/29/2022   Last metabolic panel Lab Results  Component Value Date   GLUCOSE 117 (H) 05/29/2022   NA 138 05/29/2022   K 4.4 05/29/2022   CL 101 05/29/2022   CO2 24 05/29/2022   BUN 7 05/29/2022   CREATININE 0.90 05/29/2022   EGFR 105 05/29/2022   CALCIUM 9.3 05/29/2022   PROT 6.8  05/29/2022   ALBUMIN 4.5 05/29/2022   LABGLOB 2.3 05/29/2022   AGRATIO 2.0 05/29/2022   BILITOT 0.3 05/29/2022   ALKPHOS 67 05/29/2022   AST 19 05/29/2022   ALT 14 05/29/2022   Last lipids Lab Results  Component Value Date   CHOL 149  05/29/2022   HDL 41 05/29/2022   LDLCALC 88 05/29/2022   TRIG 106 05/29/2022   CHOLHDL 3.6 05/29/2022   Last hemoglobin A1c Lab Results  Component Value Date   HGBA1C 5.7 (H) 05/29/2022   Last thyroid functions No results found for: "TSH", "T3TOTAL", "T4TOTAL", "THYROIDAB" Last vitamin D No results found for: "25OHVITD2", "25OHVITD3", "VD25OH" Last vitamin B12 and Folate No results found for: "VITAMINB12", "FOLATE"    The 10-year ASCVD risk score (Arnett DK, et al., 2019) is: 5.4%    Assessment & Plan:  1. Essential hypertension - His blood pressure is under control, will continue amlodipine 5 mg. - encouraged DASH guidelines, tobacco cessation. - Comp Met (CMET); Future  2. History of depression - He will continue on current medication,as long as he keeps up with medication management appointments - encouraged to contact [phone] or the crisis line for any acute needs, especially after he leaves [facility].  - sertraline (ZOLOFT) 50 MG tablet; Take 1 tablet (50 mg total) by mouth daily.  Dispense: 30 tablet; Refill: 0  3. Health care maintenance - will collect labs ahead of next visit. - Due for colonoscopy. Ambulatory referral to Gastroenterology - CBC w/Diff; Future - Comp Met (CMET); Future - Lipid Profile; Future - TSH; Future - HgB A1c; Future - B12 and Folate Panel; Future  4. Tingling of both feet - He was encouraged to schedule appointment with Podiatry and go to the ED for worsening symptoms. - CBC w/Diff; Future - TSH; Future - HgB A1c; Future - B12 and Folate Panel; Future  Return in about 9 weeks (around 05/21/2023), or if symptoms worsen or fail to improve.    Micael Hampshire, RN

## 2023-03-25 ENCOUNTER — Other Ambulatory Visit: Payer: Self-pay | Admitting: Gerontology

## 2023-03-25 ENCOUNTER — Telehealth: Payer: Self-pay | Admitting: Gerontology

## 2023-03-27 ENCOUNTER — Encounter: Payer: Self-pay | Admitting: *Deleted

## 2023-04-02 ENCOUNTER — Encounter: Payer: Self-pay | Admitting: Gerontology

## 2023-04-02 ENCOUNTER — Ambulatory Visit: Payer: Self-pay | Admitting: Gerontology

## 2023-04-02 ENCOUNTER — Other Ambulatory Visit: Payer: Self-pay

## 2023-04-02 VITALS — BP 118/78 | HR 76 | Temp 98.4°F | Resp 16 | Ht 73.0 in | Wt 221.9 lb

## 2023-04-02 DIAGNOSIS — Z8659 Personal history of other mental and behavioral disorders: Secondary | ICD-10-CM

## 2023-04-02 MED ORDER — SERTRALINE HCL 50 MG PO TABS
50.0000 mg | ORAL_TABLET | Freq: Every day | ORAL | 1 refills | Status: DC
  Filled 2023-04-02 – 2023-05-06 (×2): qty 30, 30d supply, fill #0

## 2023-04-02 NOTE — Progress Notes (Unsigned)
Established Patient Office Visit  Subjective   Patient ID: Maurice Murray, male    DOB: 1971-05-06  Age: 52 y.o. MRN: [id number]  Chief Complaint  Patient presents with   Follow-up   Depression    Medication management    HPI  Maurice Murray is a 52 y.o. male with history of hypertension, GERD, anxiety, depression, alcohol use who presents for mental health assessment. He states that he has a history of depression and anxiety that was diagnosed 20 years ago. He states that he took Paroxetine for 7 years before being switched to Zoloft, which he's tolerating and denies any side effect. He reports growing up in [facility], has a sister and father that are leaving and mother is diseased due to alcohol induced liver cirrhosis. 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 He denies any illicit drug use, smokes 1/2 pack of cigarette daily and admits the desire to quit. When assessing for depression, he denies insomnia, anhedonia, feelings of guilt or worthlessness, pyschomotor agitation nor retardation, and stating that his appetitie is good. He denies suicidal nor homicidal ideation. He denies history of suicide attempt, prior hospital addmision for mental health concerns nor counseling.He currently, resides at [facility] for rehabilitation. He committed himself to [facility] on 11/23/22 for Alcohol detoxification. He states that his mood is good, denies elevated mood, pressured speech, talkative and risky behavior. Overall, he states that he's doing well and offers no further complaint. MSE  He appeared stated age, well groomed, appropriately dressed and in no apparent distress.Marland Kitchen He was cooperative and engaging during interview. Psychomotor was normal, good eye contact, speech was normal, tone, volume. His mood was good, and congruent affect. He denies suicidal nor homicidal ideation, no delusion nor hallucination. His thought process was linear, coherent and goal  directed. He was alert and oriented x4, current, immediate and remote memory was intact and he has good insight and judgement.  PHQ-9 = 0 GAD-7= 0  Review of Systems  Constitutional: Negative.   Psychiatric/Behavioral:  Negative for depression, hallucinations, substance abuse and suicidal ideas. The patient is not nervous/anxious and does not have insomnia.       Objective:     BP 118/78 (BP Location: Left Arm, Patient Position: Sitting, Cuff Size: Large)   Pulse 76   Temp 98.4 F (36.9 C) (Oral)   Resp 16   Ht 6\' 1"  (1.854 m)   Wt 221 lb 14.4 oz (100.7 kg)   SpO2 93%   BMI 29.28 kg/m  BP Readings from Last 3 Encounters:  04/02/23 118/78  03/19/23 112/74  02/06/23 107/72   Wt Readings from Last 3 Encounters:  04/02/23 221 lb 14.4 oz (100.7 kg)  03/19/23 222 lb 8 oz (100.9 kg)  02/06/23 224 lb (101.6 kg)      Physical Exam Neurological:     General: No focal deficit present.     Mental Status: He is alert and oriented to person, place, and time. Mental status is at baseline.  Psychiatric:        Attention and Perception: Attention and perception normal.        Mood and Affect: Mood and affect normal.        Speech: Speech normal.        Behavior: Behavior normal.        Thought Content: Thought content normal.        Cognition and Memory: Cognition normal.  Judgment: Judgment normal.      No results found for any visits on 04/02/23.  Last CBC Lab Results  Component Value Date   WBC 6.7 05/29/2022   HGB 14.9 05/29/2022   HCT 43.2 05/29/2022   MCV 97 05/29/2022   MCH 33.4 (H) 05/29/2022   RDW 12.4 05/29/2022   PLT 181 05/29/2022   Last metabolic panel Lab Results  Component Value Date   GLUCOSE 117 (H) 05/29/2022   NA 138 05/29/2022   K 4.4 05/29/2022   CL 101 05/29/2022   CO2 24 05/29/2022   BUN 7 05/29/2022   CREATININE 0.90 05/29/2022   EGFR 105 05/29/2022   CALCIUM 9.3 05/29/2022   PROT 6.8 05/29/2022   ALBUMIN 4.5 05/29/2022    LABGLOB 2.3 05/29/2022   AGRATIO 2.0 05/29/2022   BILITOT 0.3 05/29/2022   ALKPHOS 67 05/29/2022   AST 19 05/29/2022   ALT 14 05/29/2022   Last lipids Lab Results  Component Value Date   CHOL 149 05/29/2022   HDL 41 05/29/2022   LDLCALC 88 05/29/2022   TRIG 106 05/29/2022   CHOLHDL 3.6 05/29/2022   Last hemoglobin A1c Lab Results  Component Value Date   HGBA1C 5.7 (H) 05/29/2022   Last thyroid functions No results found for: "TSH", "T3TOTAL", "T4TOTAL", "THYROIDAB"    The 10-year ASCVD risk score (Arnett DK, et al., 2019) is: 5.9%    Assessment & Plan:   1. History of depression - His depression is under control, he will continue on current medication and will consult with Psychiatrist Dr. Mare Ferrari. He was provided with Crisis help line information and advised to call with worsening symptoms. - sertraline (ZOLOFT) 50 MG tablet; Take 1 tablet (50 mg total) by mouth daily.  Dispense: 30 tablet; Refill: 1   Return in about 7 weeks (around 05/21/2023), or if symptoms worsen or fail to improve.    Eliodoro Gullett Trellis Paganini, NP

## 2023-04-08 ENCOUNTER — Other Ambulatory Visit: Payer: Self-pay

## 2023-05-06 ENCOUNTER — Other Ambulatory Visit: Payer: Self-pay

## 2023-05-21 ENCOUNTER — Other Ambulatory Visit: Payer: Self-pay

## 2023-05-21 ENCOUNTER — Ambulatory Visit: Payer: Self-pay | Admitting: Gerontology

## 2023-05-21 ENCOUNTER — Encounter: Payer: Self-pay | Admitting: Gerontology

## 2023-05-21 VITALS — BP 105/71 | HR 69 | Temp 98.3°F | Resp 16 | Ht 73.0 in | Wt 213.9 lb

## 2023-05-21 DIAGNOSIS — I10 Essential (primary) hypertension: Secondary | ICD-10-CM

## 2023-05-21 DIAGNOSIS — Z8659 Personal history of other mental and behavioral disorders: Secondary | ICD-10-CM

## 2023-05-21 DIAGNOSIS — Z0000 Encounter for general adult medical examination without abnormal findings: Secondary | ICD-10-CM

## 2023-05-21 DIAGNOSIS — R202 Paresthesia of skin: Secondary | ICD-10-CM

## 2023-05-21 MED ORDER — AMLODIPINE BESYLATE 5 MG PO TABS
5.0000 mg | ORAL_TABLET | Freq: Every day | ORAL | 1 refills | Status: DC
  Filled 2023-05-21 – 2023-09-02 (×4): qty 90, 90d supply, fill #0

## 2023-05-21 MED ORDER — SERTRALINE HCL 50 MG PO TABS
50.0000 mg | ORAL_TABLET | Freq: Every day | ORAL | 1 refills | Status: DC
  Filled 2023-05-21 – 2023-06-03 (×2): qty 30, 30d supply, fill #0
  Filled 2023-07-16: qty 30, 30d supply, fill #1

## 2023-05-21 NOTE — Progress Notes (Unsigned)
Established Patient Office Visit  Subjective   Patient ID: Maurice Murray, male    DOB: 1971-11-18  Age: 52 y.o. MRN: [id number]  Chief Complaint  Patient presents with   Follow-up   Depression   Hypertension    HPI  Maurice Murray is a 52 y.o. male with history of hypertension, GERD, anxiety, depression, alcohol use who presents for follow up visit. He states that he's compliant with his medications, denies side effects and continues to make healthy lifestyle changes. He will be leaving [facility] on July 1st and will continue to reside in [place]. He states that his mood is good, denies suicidal nor homicidal ideation. Overall, he states that he's doing well and offers no further compliant. GAD-7 =0 PHQ-9 =0    Review of Systems  Constitutional: Negative.   Eyes: Negative.   Respiratory: Negative.    Cardiovascular: Negative.   Skin: Negative.   Neurological: Negative.   Psychiatric/Behavioral: Negative.          Objective:     BP 105/71 (BP Location: Right Arm, Patient Position: Sitting, Cuff Size: Large)   Pulse 69   Temp 98.3 F (36.8 C) (Oral)   Resp 16   Ht 6\' 1"  (1.854 m)   Wt 213 lb 14.4 oz (97 kg)   SpO2 95%   BMI 28.22 kg/m  BP Readings from Last 3 Encounters:  05/21/23 105/71  04/02/23 118/78  03/19/23 112/74   Wt Readings from Last 3 Encounters:  05/21/23 213 lb 14.4 oz (97 kg)  04/02/23 221 lb 14.4 oz (100.7 kg)  03/19/23 222 lb 8 oz (100.9 kg)      Physical Exam HENT:     Head: Normocephalic and atraumatic.     Mouth/Throat:     Mouth: Mucous membranes are moist.  Eyes:     Extraocular Movements: Extraocular movements intact.     Conjunctiva/sclera: Conjunctivae normal.     Pupils: Pupils are equal, round, and reactive to light.  Cardiovascular:     Rate and Rhythm: Normal rate and regular rhythm.     Pulses: Normal pulses.     Heart sounds: Normal heart sounds.  Pulmonary:     Effort: Pulmonary effort is normal.     Breath  sounds: Normal breath sounds.  Skin:    General: Skin is warm.  Neurological:     General: No focal deficit present.     Mental Status: He is alert and oriented to person, place, and time. Mental status is at baseline.  Psychiatric:        Mood and Affect: Mood normal.        Behavior: Behavior normal.        Thought Content: Thought content normal.        Judgment: Judgment normal.      Results for orders placed or performed in visit on 05/21/23  B12 and Folate Panel  Result Value Ref Range   Vitamin B-12 211 (L) 232 - 1,245 pg/mL   Folate 5.7 >3.0 ng/mL  HgB A1c  Result Value Ref Range   Hgb A1c MFr Bld 5.8 (H) 4.8 - 5.6 %   Est. average glucose Bld gHb Est-mCnc 120 mg/dL  TSH  Result Value Ref Range   TSH 1.520 0.450 - 4.500 uIU/mL  Lipid Profile  Result Value Ref Range   Cholesterol, Total 124 100 - 199 mg/dL   Triglycerides 50 0 - 149 mg/dL   HDL 37 (L) >40 mg/dL  VLDL Cholesterol Cal 11 5 - 40 mg/dL   LDL Chol Calc (NIH) 76 0 - 99 mg/dL   Chol/HDL Ratio 3.4 0.0 - 5.0 ratio  Comp Met (CMET)  Result Value Ref Range   Glucose 91 70 - 99 mg/dL   BUN 17 6 - 24 mg/dL   Creatinine, Ser 1.61 (L) 0.76 - 1.27 mg/dL   eGFR 096 >04 VW/UJW/1.19   BUN/Creatinine Ratio 23 (H) 9 - 20   Sodium 136 134 - 144 mmol/L   Potassium 4.4 3.5 - 5.2 mmol/L   Chloride 102 96 - 106 mmol/L   CO2 24 20 - 29 mmol/L   Calcium 9.1 8.7 - 10.2 mg/dL   Total Protein 6.5 6.0 - 8.5 g/dL   Albumin 4.3 4.1 - 5.1 g/dL   Globulin, Total 2.2 1.5 - 4.5 g/dL   Bilirubin Total 0.5 0.0 - 1.2 mg/dL   Alkaline Phosphatase 86 44 - 121 IU/L   AST 23 0 - 40 IU/L   ALT 21 0 - 44 IU/L  CBC w/Diff  Result Value Ref Range   WBC 8.2 3.4 - 10.8 x10E3/uL   RBC 4.71 4.14 - 5.80 x10E6/uL   Hemoglobin 14.8 13.0 - 17.7 g/dL   Hematocrit 14.7 82.9 - 51.0 %   MCV 94 79 - 97 fL   MCH 31.4 26.6 - 33.0 pg   MCHC 33.4 31.5 - 35.7 g/dL   RDW 56.2 13.0 - 86.5 %   Platelets 197 150 - 450 x10E3/uL   Neutrophils 57 Not  Estab. %   Lymphs 33 Not Estab. %   Monocytes 6 Not Estab. %   Eos 3 Not Estab. %   Basos 1 Not Estab. %   Neutrophils Absolute 4.7 1.4 - 7.0 x10E3/uL   Lymphocytes Absolute 2.7 0.7 - 3.1 x10E3/uL   Monocytes Absolute 0.5 0.1 - 0.9 x10E3/uL   EOS (ABSOLUTE) 0.3 0.0 - 0.4 x10E3/uL   Basophils Absolute 0.1 0.0 - 0.2 x10E3/uL   Immature Granulocytes 0 Not Estab. %   Immature Grans (Abs) 0.0 0.0 - 0.1 x10E3/uL    Last CBC Lab Results  Component Value Date   WBC 8.2 05/21/2023   HGB 14.8 05/21/2023   HCT 44.3 05/21/2023   MCV 94 05/21/2023   MCH 31.4 05/21/2023   RDW 11.8 05/21/2023   PLT 197 05/21/2023   Last metabolic panel Lab Results  Component Value Date   GLUCOSE 91 05/21/2023   NA 136 05/21/2023   K 4.4 05/21/2023   CL 102 05/21/2023   CO2 24 05/21/2023   BUN 17 05/21/2023   CREATININE 0.74 (L) 05/21/2023   EGFR 111 05/21/2023   CALCIUM 9.1 05/21/2023   PROT 6.5 05/21/2023   ALBUMIN 4.3 05/21/2023   LABGLOB 2.2 05/21/2023   AGRATIO 2.0 05/29/2022   BILITOT 0.5 05/21/2023   ALKPHOS 86 05/21/2023   AST 23 05/21/2023   ALT 21 05/21/2023   Last lipids Lab Results  Component Value Date   CHOL 124 05/21/2023   HDL 37 (L) 05/21/2023   LDLCALC 76 05/21/2023   TRIG 50 05/21/2023   CHOLHDL 3.4 05/21/2023   Last hemoglobin A1c Lab Results  Component Value Date   HGBA1C 5.8 (H) 05/21/2023   Last thyroid functions Lab Results  Component Value Date   TSH 1.520 05/21/2023   Last vitamin D No results found for: "25OHVITD2", "25OHVITD3", "VD25OH" Last vitamin B12 and Folate Lab Results  Component Value Date   VITAMINB12 211 (L) 05/21/2023  FOLATE 5.7 05/21/2023      The ASCVD Risk score (Arnett DK, et al., 2019) failed to calculate for the following reasons:   The valid total cholesterol range is 130 to 320 mg/dL    Assessment & Plan:  1. Essential hypertension - His blood pressure is under control, will continue current medication, DASH diet and  exercise as tolerated. - amLODipine (NORVASC) 5 MG tablet; Take 1 tablet (5 mg total) by mouth daily.  Dispense: 90 tablet; Refill: 1 - Comp Met (CMET)  2. History of depression - His depression is stable, will continue on current medication. He was advised to call the Crisis help line with worsening symptoms. - sertraline (ZOLOFT) 50 MG tablet; Take 1 tablet (50 mg total) by mouth daily.  Dispense: 30 tablet; Refill: 1  3. Healthcare maintenance - He was encouraged to complete the [facility] financial application for Colonoscopy screening. - Ambulatory referral to Gastroenterology  4. Health care maintenance - Routine labs will be checked. - B12 and Folate Panel - HgB A1c - TSH - Lipid Profile - Comp Met (CMET) - CBC w/Diff    5. Tingling of both feet -His Vitamin B 12 was 211 pg/ml, he was started on 500 mg Cyanocobalamin daily, was educated on medication side effects and advised to notify clinic. - B12 and Folate Panel - HgB A1c - TSH - CBC w/Diff - cyanocobalamin (VITAMIN B12) 500 MCG tablet; Take 1 tablet (500 mcg total) by mouth daily.  Dispense: 30 tablet; Refill: 3   Return in about 3 months (around 08/21/2023), or if symptoms worsen or fail to improve.    Makaelah Cranfield Trellis Paganini, NP

## 2023-05-21 NOTE — Patient Instructions (Signed)

## 2023-05-22 ENCOUNTER — Other Ambulatory Visit: Payer: Self-pay

## 2023-05-22 LAB — LIPID PANEL
Chol/HDL Ratio: 3.4 ratio (ref 0.0–5.0)
Cholesterol, Total: 124 mg/dL (ref 100–199)
HDL: 37 mg/dL — ABNORMAL LOW (ref >39)
LDL Chol Calc (NIH): 76 mg/dL (ref 0–99)
Triglycerides: 50 mg/dL (ref 0–149)
VLDL Cholesterol Cal: 11 mg/dL (ref 5–40)

## 2023-05-22 LAB — B12 AND FOLATE PANEL
Folate: 5.7 ng/mL (ref >3.0)
Vitamin B-12: 211 pg/mL — ABNORMAL LOW (ref 232–1245)

## 2023-05-22 LAB — COMPREHENSIVE METABOLIC PANEL
ALT: 21 IU/L (ref 0–44)
AST: 23 IU/L (ref 0–40)
Albumin: 4.3 g/dL (ref 4.1–5.1)
Alkaline Phosphatase: 86 IU/L (ref 44–121)
BUN/Creatinine Ratio: 23 — ABNORMAL HIGH (ref 9–20)
BUN: 17 mg/dL (ref 6–24)
Bilirubin Total: 0.5 mg/dL (ref 0.0–1.2)
CO2: 24 mmol/L (ref 20–29)
Calcium: 9.1 mg/dL (ref 8.7–10.2)
Chloride: 102 mmol/L (ref 96–106)
Creatinine, Ser: 0.74 mg/dL — ABNORMAL LOW (ref 0.76–1.27)
Globulin, Total: 2.2 g/dL (ref 1.5–4.5)
Glucose: 91 mg/dL (ref 70–99)
Potassium: 4.4 mmol/L (ref 3.5–5.2)
Sodium: 136 mmol/L (ref 134–144)
Total Protein: 6.5 g/dL (ref 6.0–8.5)
eGFR: 111 mL/min/{1.73_m2} (ref >59)

## 2023-05-22 LAB — CBC WITH DIFFERENTIAL/PLATELET
Basophils Absolute: 0.1 10*3/uL (ref 0.0–0.2)
Basos: 1 %
EOS (ABSOLUTE): 0.3 10*3/uL (ref 0.0–0.4)
Eos: 3 %
Hematocrit: 44.3 % (ref 37.5–51.0)
Hemoglobin: 14.8 g/dL (ref 13.0–17.7)
Immature Grans (Abs): 0 10*3/uL (ref 0.0–0.1)
Immature Granulocytes: 0 %
Lymphocytes Absolute: 2.7 10*3/uL (ref 0.7–3.1)
Lymphs: 33 %
MCH: 31.4 pg (ref 26.6–33.0)
MCHC: 33.4 g/dL (ref 31.5–35.7)
MCV: 94 fL (ref 79–97)
Monocytes Absolute: 0.5 10*3/uL (ref 0.1–0.9)
Monocytes: 6 %
Neutrophils Absolute: 4.7 10*3/uL (ref 1.4–7.0)
Neutrophils: 57 %
Platelets: 197 10*3/uL (ref 150–450)
RBC: 4.71 x10E6/uL (ref 4.14–5.80)
RDW: 11.8 % (ref 11.6–15.4)
WBC: 8.2 10*3/uL (ref 3.4–10.8)

## 2023-05-22 LAB — HEMOGLOBIN A1C
Est. average glucose Bld gHb Est-mCnc: 120 mg/dL
Hgb A1c MFr Bld: 5.8 % — ABNORMAL HIGH (ref 4.8–5.6)

## 2023-05-22 LAB — TSH: TSH: 1.52 u[IU]/mL (ref 0.450–4.500)

## 2023-05-22 MED ORDER — CYANOCOBALAMIN 500 MCG PO TABS
500.0000 ug | ORAL_TABLET | Freq: Every day | ORAL | 3 refills | Status: AC
Start: 2023-05-22 — End: ?
  Filled 2023-05-22 – 2024-03-02 (×2): qty 30, 30d supply, fill #0
  Filled 2024-03-02: qty 30, 30d supply, fill #1

## 2023-05-28 ENCOUNTER — Encounter: Payer: Self-pay | Admitting: *Deleted

## 2023-06-03 ENCOUNTER — Other Ambulatory Visit: Payer: Self-pay

## 2023-06-12 ENCOUNTER — Emergency Department
Admission: EM | Admit: 2023-06-12 | Discharge: 2023-06-12 | Disposition: A | Discharge status: Home / Self Care | Payer: Self-pay | Attending: Emergency Medicine | Admitting: Emergency Medicine

## 2023-06-12 ENCOUNTER — Other Ambulatory Visit: Payer: Self-pay

## 2023-06-12 DIAGNOSIS — F10920 Alcohol use, unspecified with intoxication, uncomplicated: Secondary | ICD-10-CM

## 2023-06-12 DIAGNOSIS — Y908 Blood alcohol level of 240 mg/100 ml or more: Secondary | ICD-10-CM | POA: Insufficient documentation

## 2023-06-12 DIAGNOSIS — F1010 Alcohol abuse, uncomplicated: Secondary | ICD-10-CM

## 2023-06-12 DIAGNOSIS — F10120 Alcohol abuse with intoxication, uncomplicated: Secondary | ICD-10-CM | POA: Insufficient documentation

## 2023-06-12 LAB — BASIC METABOLIC PANEL
Anion gap: 21 — ABNORMAL HIGH (ref 5–15)
BUN: 11 mg/dL (ref 6–20)
CO2: 19 mmol/L — ABNORMAL LOW (ref 22–32)
Calcium: 8.4 mg/dL — ABNORMAL LOW (ref 8.9–10.3)
Chloride: 94 mmol/L — ABNORMAL LOW (ref 98–111)
Creatinine, Ser: 0.66 mg/dL (ref 0.61–1.24)
GFR, Estimated: >60 mL/min (ref >60)
Glucose, Bld: 105 mg/dL — ABNORMAL HIGH (ref 70–99)
Potassium: 4.4 mmol/L (ref 3.5–5.1)
Sodium: 134 mmol/L — ABNORMAL LOW (ref 135–145)

## 2023-06-12 LAB — CBC
HCT: 50.4 % (ref 39.0–52.0)
Hemoglobin: 17.3 g/dL — ABNORMAL HIGH (ref 13.0–17.0)
MCH: 31.4 pg (ref 26.0–34.0)
MCHC: 34.3 g/dL (ref 30.0–36.0)
MCV: 91.5 fL (ref 80.0–100.0)
Platelets: 190 10*3/uL (ref 150–400)
RBC: 5.51 MIL/uL (ref 4.22–5.81)
RDW: 12.5 % (ref 11.5–15.5)
WBC: 9.7 10*3/uL (ref 4.0–10.5)
nRBC: 0 % (ref 0.0–0.2)

## 2023-06-12 LAB — ETHANOL: Alcohol, Ethyl (B): 344 mg/dL (ref <10)

## 2023-06-12 MED ORDER — CHLORDIAZEPOXIDE HCL 25 MG PO CAPS: ORAL_CAPSULE | ORAL | 0 refills | Status: AC

## 2023-06-12 MED ORDER — CHLORDIAZEPOXIDE HCL 25 MG PO CAPS
50.0000 mg | ORAL_CAPSULE | Freq: Once | ORAL | Status: AC
  Administered 2023-06-12: 50 mg via ORAL
  Filled 2023-06-12: qty 2

## 2023-06-12 NOTE — ED Triage Notes (Signed)
Pt here asking for help with detox. Pt states he drinks about 15 beers a day. Pt informed that we do not provide detox here, pt agreeable to stay and talk to a physician. Pt calm and cooperative, asking for something for withdrawals. Pt states his last drink was 1 hour ago.

## 2023-06-12 NOTE — ED Provider Notes (Signed)
[facility] Provider Note   Event Date/Time   First MD Initiated Contact with Patient 06/12/23 1506     (approximate) History  No chief complaint on file.  HPI Maurice Murray is a 52 y.o. male with a past medical history of alcohol withdrawal and current abuse who presents complaining of symptoms of alcohol withdrawal and wanting help quitting.  Patient states that he has done inpatient and outpatient alcohol detoxification facilities in the past and wishes to be provided with resources here today.  Patient states that he is 6 amenable to inpatient or outpatient detoxification and evaluation as for resources today. ROS: Patient currently denies any vision changes, tinnitus, difficulty speaking, facial droop, sore throat, chest pain, shortness of breath, abdominal pain, nausea/vomiting/diarrhea, dysuria, or weakness/numbness/paresthesias in any extremity   Physical Exam  Triage Vital Signs: ED Triage Vitals  Encounter Vitals Group     BP 06/12/23 1414 (!) 120/93     Systolic BP Percentile --      Diastolic BP Percentile --      Pulse Rate 06/12/23 1414 (!) 136     Resp 06/12/23 1414 (!) 21     Temp 06/12/23 1414 98.6 F (37 C)     Temp Source 06/12/23 1414 Oral     SpO2 06/12/23 1414 95 %     Weight 06/12/23 1415 213 lb 13.5 oz (97 kg)     Height 06/12/23 1415 6\' 1"  (1.854 m)     Head Circumference --      Peak Flow --      Pain Score 06/12/23 1415 5     Pain Loc --      Pain Education --      Exclude from Growth Chart --    Most recent vital signs: Vitals:   06/12/23 1414  BP: (!) 120/93  Pulse: (!) 136  Resp: (!) 21  Temp: 98.6 F (37 C)  SpO2: 95%   General: Awake, oriented x4. CV:  Good peripheral perfusion.  Resp:  Normal effort.  Abd:  No distention.  Other:  Middle-aged well-developed, well-nourished Caucasian male laying in bed in no acute distress.  No tremors.  Ambulatory with no difficulty. ED Results / Procedures / Treatments   Labs (all labs ordered are listed, but only abnormal results are displayed) Labs Reviewed  CBC - Abnormal; Notable for the following components:      Result Value   Hemoglobin 17.3 (*)    All other components within normal limits  BASIC METABOLIC PANEL - Abnormal; Notable for the following components:   Sodium 134 (*)    Chloride 94 (*)    CO2 19 (*)    Glucose, Bld 105 (*)    Calcium 8.4 (*)    Anion gap 21 (*)    All other components within normal limits  ETHANOL - Abnormal; Notable for the following components:   Alcohol, Ethyl (B) 344 (*)    All other components within normal limits   EKG ED ECG REPORT I, Merwyn Katos, the attending physician, personally viewed and interpreted this ECG. Date: 06/12/2023 EKG Time: 1418 Rate: 135 Rhythm: Tachycardic sinus rhythm QRS Axis: normal Intervals: Right bundle branch block ST/T Wave abnormalities: normal Narrative Interpretation: Tachycardic sinus rhythm with right bundle branch block.  No evidence of acute ischemia PROCEDURES: Critical Care performed: No Procedures MEDICATIONS ORDERED IN ED: Medications  chlordiazePOXIDE (LIBRIUM) capsule 50 mg (50 mg Oral Given 06/12/23 1526)   IMPRESSION / MDM /  ASSESSMENT AND PLAN / ED COURSE  I reviewed the triage vital signs and the nursing notes.              The patient is on the cardiac monitor to evaluate for evidence of arrhythmia and/or significant heart rate changes. Patient's presentation is most consistent with acute presentation with potential threat to life or bodily function. Presents with altered mental status. +Slurred, sluggish behavior. Stated EtOH intoxication. Airway maintained. Unlikely intracranial bleed, opioid intoxication or coingestion, sepsis, hypothyroidism. Suspect likely transient course of intoxication with expected  improvement of symptoms as patient metabolizes offending agent.  Plan: frequent reassessments  Reassessment Note: Time: 2 hours since  initial presentation. Evaluation: Frequent mental status exams showed improving symptoms and evidence that the patients AMS was secondary to intoxication. Pt able to ambulate without difficulty and PO tolerant.  Patient given resources for outpatient and inpatient detoxification as well as Librium taper.  Plan DC home with ride and return precautions. Disposition: Discharge home    FINAL CLINICAL IMPRESSION(S) / ED DIAGNOSES   Final diagnoses:  Alcohol abuse  Alcoholic intoxication without complication (HCC)   Rx / DC Orders   ED Discharge Orders          Ordered    chlordiazePOXIDE (LIBRIUM) 25 MG capsule  Multiple Frequencies        06/12/23 1512           Note:  This document was prepared using Dragon voice recognition software and may include unintentional dictation errors.   Merwyn Katos, MD 06/12/23 [phone]

## 2023-07-16 ENCOUNTER — Other Ambulatory Visit: Payer: Self-pay

## 2023-07-28 ENCOUNTER — Encounter: Payer: Self-pay | Admitting: *Deleted

## 2023-07-28 ENCOUNTER — Emergency Department: Payer: Self-pay

## 2023-07-28 ENCOUNTER — Other Ambulatory Visit: Payer: Self-pay

## 2023-07-28 DIAGNOSIS — Z5321 Procedure and treatment not carried out due to patient leaving prior to being seen by health care provider: Secondary | ICD-10-CM | POA: Insufficient documentation

## 2023-07-28 DIAGNOSIS — R0789 Other chest pain: Secondary | ICD-10-CM | POA: Insufficient documentation

## 2023-07-28 DIAGNOSIS — R112 Nausea with vomiting, unspecified: Secondary | ICD-10-CM | POA: Insufficient documentation

## 2023-07-28 DIAGNOSIS — R0602 Shortness of breath: Secondary | ICD-10-CM | POA: Insufficient documentation

## 2023-07-28 LAB — CBC
HCT: 52.6 % — ABNORMAL HIGH (ref 39.0–52.0)
Hemoglobin: 18.1 g/dL — ABNORMAL HIGH (ref 13.0–17.0)
MCH: 31.2 pg (ref 26.0–34.0)
MCHC: 34.4 g/dL (ref 30.0–36.0)
MCV: 90.7 fL (ref 80.0–100.0)
Platelets: 260 10*3/uL (ref 150–400)
RBC: 5.8 MIL/uL (ref 4.22–5.81)
RDW: 12.6 % (ref 11.5–15.5)
WBC: 13.2 10*3/uL — ABNORMAL HIGH (ref 4.0–10.5)
nRBC: 0 % (ref 0.0–0.2)

## 2023-07-28 LAB — BASIC METABOLIC PANEL
Anion gap: 17 — ABNORMAL HIGH (ref 5–15)
BUN: 11 mg/dL (ref 6–20)
CO2: 24 mmol/L (ref 22–32)
Calcium: 9.1 mg/dL (ref 8.9–10.3)
Chloride: 96 mmol/L — ABNORMAL LOW (ref 98–111)
Creatinine, Ser: 0.82 mg/dL (ref 0.61–1.24)
GFR, Estimated: >60 mL/min (ref >60)
Glucose, Bld: 117 mg/dL — ABNORMAL HIGH (ref 70–99)
Potassium: 4.4 mmol/L (ref 3.5–5.1)
Sodium: 137 mmol/L (ref 135–145)

## 2023-07-28 LAB — TROPONIN I (HIGH SENSITIVITY): Troponin I (High Sensitivity): 6 ng/L (ref <18)

## 2023-07-28 NOTE — ED Triage Notes (Signed)
Pt ambulatory to triage.  Pt has chest pain for 2 days.  Pt also reports sob.  Pt has n/v.  Nonradiating pain   pt alert  speech clear.

## 2023-07-28 NOTE — ED Notes (Signed)
 Pt denied being able to produce a urine sample at this time. Pt provided with a labeled specimen cup and instructions to return cup to triage nurse desk once it has a clean catch urine sample.

## 2023-07-29 ENCOUNTER — Emergency Department
Admission: EM | Admit: 2023-07-29 | Discharge: 2023-07-29 | Discharge status: Against Medical Advice | Payer: Self-pay | Attending: Emergency Medicine | Admitting: Emergency Medicine

## 2023-07-29 NOTE — ED Notes (Signed)
No answer when called several times from lobby 

## 2023-08-20 ENCOUNTER — Ambulatory Visit: Payer: Self-pay | Admitting: Gerontology

## 2023-08-21 ENCOUNTER — Other Ambulatory Visit: Payer: Self-pay | Admitting: Gerontology

## 2023-08-21 ENCOUNTER — Other Ambulatory Visit: Payer: Self-pay

## 2023-08-21 DIAGNOSIS — Z8659 Personal history of other mental and behavioral disorders: Secondary | ICD-10-CM

## 2023-08-21 MED ORDER — SERTRALINE HCL 50 MG PO TABS
50.0000 mg | ORAL_TABLET | Freq: Every day | ORAL | 0 refills | Status: DC
Start: 2023-08-21 — End: 2024-01-05
  Filled 2023-08-21 – 2023-09-02 (×2): qty 14, 14d supply, fill #0

## 2023-08-26 ENCOUNTER — Ambulatory Visit: Payer: Self-pay | Admitting: Gerontology

## 2023-08-28 ENCOUNTER — Telehealth: Payer: Self-pay

## 2023-08-28 NOTE — Telephone Encounter (Signed)
Called to R/S appt. No answer left vmail.

## 2023-09-01 ENCOUNTER — Other Ambulatory Visit: Payer: Self-pay

## 2023-09-02 ENCOUNTER — Other Ambulatory Visit: Payer: Self-pay

## 2023-09-09 ENCOUNTER — Ambulatory Visit: Payer: Self-pay | Admitting: Gerontology

## 2023-09-11 ENCOUNTER — Ambulatory Visit: Payer: Self-pay | Admitting: Gerontology

## 2023-11-26 ENCOUNTER — Inpatient Hospital Stay
Admission: EM | Admit: 2023-11-26 | Discharge: 2023-11-28 | DRG: 894 | Discharge status: Against Medical Advice | Payer: MEDICAID | Attending: Internal Medicine | Admitting: Internal Medicine

## 2023-11-26 ENCOUNTER — Other Ambulatory Visit: Payer: Self-pay

## 2023-11-26 DIAGNOSIS — F1094 Alcohol use, unspecified with alcohol-induced mood disorder: Secondary | ICD-10-CM | POA: Diagnosis not present

## 2023-11-26 DIAGNOSIS — F1024 Alcohol dependence with alcohol-induced mood disorder: Secondary | ICD-10-CM | POA: Diagnosis present

## 2023-11-26 DIAGNOSIS — Z79899 Other long term (current) drug therapy: Secondary | ICD-10-CM

## 2023-11-26 DIAGNOSIS — J449 Chronic obstructive pulmonary disease, unspecified: Secondary | ICD-10-CM | POA: Diagnosis present

## 2023-11-26 DIAGNOSIS — K219 Gastro-esophageal reflux disease without esophagitis: Secondary | ICD-10-CM | POA: Diagnosis present

## 2023-11-26 DIAGNOSIS — F419 Anxiety disorder, unspecified: Secondary | ICD-10-CM | POA: Diagnosis present

## 2023-11-26 DIAGNOSIS — D696 Thrombocytopenia, unspecified: Secondary | ICD-10-CM | POA: Diagnosis present

## 2023-11-26 DIAGNOSIS — I10 Essential (primary) hypertension: Secondary | ICD-10-CM | POA: Diagnosis present

## 2023-11-26 DIAGNOSIS — K7010 Alcoholic hepatitis without ascites: Secondary | ICD-10-CM | POA: Diagnosis present

## 2023-11-26 DIAGNOSIS — Z811 Family history of alcohol abuse and dependence: Secondary | ICD-10-CM

## 2023-11-26 DIAGNOSIS — F10229 Alcohol dependence with intoxication, unspecified: Secondary | ICD-10-CM | POA: Diagnosis present

## 2023-11-26 DIAGNOSIS — F17210 Nicotine dependence, cigarettes, uncomplicated: Secondary | ICD-10-CM | POA: Diagnosis present

## 2023-11-26 DIAGNOSIS — Z5329 Procedure and treatment not carried out because of patient's decision for other reasons: Secondary | ICD-10-CM | POA: Diagnosis present

## 2023-11-26 DIAGNOSIS — E785 Hyperlipidemia, unspecified: Secondary | ICD-10-CM | POA: Diagnosis present

## 2023-11-26 DIAGNOSIS — Z91148 Patient's other noncompliance with medication regimen for other reason: Secondary | ICD-10-CM | POA: Diagnosis not present

## 2023-11-26 DIAGNOSIS — F32A Depression, unspecified: Secondary | ICD-10-CM | POA: Diagnosis present

## 2023-11-26 DIAGNOSIS — I2510 Atherosclerotic heart disease of native coronary artery without angina pectoris: Secondary | ICD-10-CM | POA: Diagnosis present

## 2023-11-26 DIAGNOSIS — F10231 Alcohol dependence with withdrawal delirium: Secondary | ICD-10-CM | POA: Diagnosis present

## 2023-11-26 DIAGNOSIS — Z8249 Family history of ischemic heart disease and other diseases of the circulatory system: Secondary | ICD-10-CM | POA: Diagnosis not present

## 2023-11-26 DIAGNOSIS — K760 Fatty (change of) liver, not elsewhere classified: Secondary | ICD-10-CM | POA: Diagnosis present

## 2023-11-26 DIAGNOSIS — K709 Alcoholic liver disease, unspecified: Secondary | ICD-10-CM

## 2023-11-26 DIAGNOSIS — F10939 Alcohol use, unspecified with withdrawal, unspecified: Principal | ICD-10-CM | POA: Diagnosis present

## 2023-11-26 DIAGNOSIS — F10931 Alcohol use, unspecified with withdrawal delirium: Secondary | ICD-10-CM | POA: Diagnosis not present

## 2023-11-26 LAB — CBC
HCT: 44 % (ref 39.0–52.0)
Hemoglobin: 15.3 g/dL (ref 13.0–17.0)
MCH: 33.5 pg (ref 26.0–34.0)
MCHC: 34.8 g/dL (ref 30.0–36.0)
MCV: 96.3 fL (ref 80.0–100.0)
Platelets: 54 10*3/uL — ABNORMAL LOW (ref 150–400)
RBC: 4.57 MIL/uL (ref 4.22–5.81)
RDW: 12.6 % (ref 11.5–15.5)
WBC: 8.2 10*3/uL (ref 4.0–10.5)
nRBC: 0 % (ref 0.0–0.2)

## 2023-11-26 LAB — HIV ANTIBODY (ROUTINE TESTING W REFLEX): HIV Screen 4th Generation wRfx: NONREACTIVE

## 2023-11-26 LAB — COMPREHENSIVE METABOLIC PANEL
ALT: 287 U/L — ABNORMAL HIGH (ref 0–44)
AST: 630 U/L — ABNORMAL HIGH (ref 15–41)
Albumin: 4 g/dL (ref 3.5–5.0)
Alkaline Phosphatase: 70 U/L (ref 38–126)
Anion gap: 16 — ABNORMAL HIGH (ref 5–15)
BUN: 12 mg/dL (ref 6–20)
CO2: 23 mmol/L (ref 22–32)
Calcium: 7.9 mg/dL — ABNORMAL LOW (ref 8.9–10.3)
Chloride: 97 mmol/L — ABNORMAL LOW (ref 98–111)
Creatinine, Ser: 0.68 mg/dL (ref 0.61–1.24)
GFR, Estimated: >60 mL/min (ref >60)
Glucose, Bld: 136 mg/dL — ABNORMAL HIGH (ref 70–99)
Potassium: 3.7 mmol/L (ref 3.5–5.1)
Sodium: 136 mmol/L (ref 135–145)
Total Bilirubin: 1.4 mg/dL — ABNORMAL HIGH (ref <1.2)
Total Protein: 6.4 g/dL — ABNORMAL LOW (ref 6.5–8.1)

## 2023-11-26 LAB — LIPASE, BLOOD: Lipase: 44 U/L (ref 11–51)

## 2023-11-26 LAB — PROTIME-INR
INR: 1 (ref 0.8–1.2)
Prothrombin Time: 13.5 s (ref 11.4–15.2)

## 2023-11-26 LAB — ETHANOL: Alcohol, Ethyl (B): 346 mg/dL (ref <10)

## 2023-11-26 MED ORDER — PHENOBARBITAL 97.2 MG PO TABS
97.2000 mg | ORAL_TABLET | Freq: Three times a day (TID) | ORAL | Status: AC
  Administered 2023-11-26 – 2023-11-27 (×5): 97.2 mg via ORAL
  Filled 2023-11-26 (×6): qty 1

## 2023-11-26 MED ORDER — SERTRALINE HCL 50 MG PO TABS
100.0000 mg | ORAL_TABLET | Freq: Every day | ORAL | Status: DC
  Administered 2023-11-26: 100 mg via ORAL
  Filled 2023-11-26: qty 2

## 2023-11-26 MED ORDER — INFUVITE ADULT IV SOLN
INTRAVENOUS | Status: DC
  Filled 2023-11-26: qty 1000

## 2023-11-26 MED ORDER — LORAZEPAM 2 MG/ML IJ SOLN
0.0000 mg | Freq: Four times a day (QID) | INTRAMUSCULAR | Status: AC
  Administered 2023-11-26 (×2): 2 mg via INTRAVENOUS
  Administered 2023-11-26: 1 mg via INTRAVENOUS
  Administered 2023-11-26 – 2023-11-27 (×2): 2 mg via INTRAVENOUS
  Administered 2023-11-27: 1 mg via INTRAVENOUS
  Administered 2023-11-27: 2 mg via INTRAVENOUS
  Filled 2023-11-26 (×8): qty 1

## 2023-11-26 MED ORDER — THIAMINE HCL 100 MG PO TABS
100.0000 mg | ORAL_TABLET | Freq: Every day | ORAL | Status: DC
Start: 2023-11-26 — End: 2023-11-28
  Administered 2023-11-26 – 2023-11-28 (×3): 100 mg via ORAL
  Filled 2023-11-26 (×4): qty 1

## 2023-11-26 MED ORDER — ARIPIPRAZOLE 2 MG PO TABS
2.0000 mg | ORAL_TABLET | Freq: Every day | ORAL | Status: DC
  Administered 2023-11-26 – 2023-11-28 (×3): 2 mg via ORAL
  Filled 2023-11-26 (×3): qty 1

## 2023-11-26 MED ORDER — PHENOBARBITAL SODIUM 130 MG/ML IJ SOLN
130.0000 mg | Freq: Once | INTRAMUSCULAR | Status: AC
  Administered 2023-11-26: 130 mg via INTRAVENOUS
  Filled 2023-11-26: qty 1

## 2023-11-26 MED ORDER — IBUPROFEN 400 MG PO TABS
400.0000 mg | ORAL_TABLET | Freq: Four times a day (QID) | ORAL | Status: DC | PRN
  Administered 2023-11-26: 400 mg via ORAL
  Filled 2023-11-26 (×2): qty 1

## 2023-11-26 MED ORDER — PHENOBARBITAL 32.4 MG PO TABS: 32.4000 mg | ORAL_TABLET | Freq: Three times a day (TID) | ORAL | Status: DC

## 2023-11-26 MED ORDER — PANTOPRAZOLE SODIUM 40 MG PO TBEC
40.0000 mg | DELAYED_RELEASE_TABLET | Freq: Every day | ORAL | Status: DC
Start: 2023-11-26 — End: 2023-11-28
  Administered 2023-11-26 – 2023-11-28 (×3): 40 mg via ORAL
  Filled 2023-11-26 (×3): qty 1

## 2023-11-26 MED ORDER — SODIUM CHLORIDE 0.9 % IV BOLUS
1000.0000 mL | Freq: Once | INTRAVENOUS | Status: AC
Start: 2023-11-26 — End: 2023-11-26
  Administered 2023-11-26: 1000 mL via INTRAVENOUS

## 2023-11-26 MED ORDER — AMLODIPINE BESYLATE 5 MG PO TABS
5.0000 mg | ORAL_TABLET | Freq: Every day | ORAL | Status: DC
  Administered 2023-11-26: 5 mg via ORAL
  Filled 2023-11-26: qty 1

## 2023-11-26 MED ORDER — LORAZEPAM 2 MG/ML IJ SOLN
1.0000 mg | INTRAMUSCULAR | Status: DC | PRN
  Administered 2023-11-26 – 2023-11-28 (×2): 1 mg via INTRAVENOUS
  Filled 2023-11-26 (×2): qty 1

## 2023-11-26 MED ORDER — ONDANSETRON HCL 4 MG/2ML IJ SOLN: 4.0000 mg | Freq: Four times a day (QID) | INTRAMUSCULAR | Status: DC | PRN

## 2023-11-26 MED ORDER — PHENOBARBITAL 32.4 MG PO TABS
64.8000 mg | ORAL_TABLET | Freq: Three times a day (TID) | ORAL | Status: DC
Start: 2023-11-28 — End: 2023-11-28
  Administered 2023-11-28: 64.8 mg via ORAL
  Filled 2023-11-26: qty 2

## 2023-11-26 NOTE — ED Triage Notes (Signed)
Pt reports having ETOH withdrawal. Last drink 6 hours ago. States has had DT before. Tremors noted. Reports +h/a and having hallucinations.

## 2023-11-26 NOTE — ED Notes (Signed)
Pt not wanting to keep monitor wires on

## 2023-11-26 NOTE — Plan of Care (Signed)

## 2023-11-26 NOTE — Consult Note (Signed)
[facility] Psychiatric Consult Initial  Patient Name: .Maurice Murray  MRN: [id number]  DOB: September 04, 1971  Consult Order details:  Orders (From admission, onward)     Start     Ordered   11/26/23 0738  IP CONSULT TO PSYCHIATRY       Ordering Provider: Emeline General, MD  Provider:  (Not yet assigned)  Question Answer Comment  Location [facility]   Reason for Consult? depression, zoloft does not work anymore      11/26/23 0737             Mode of Visit: In person    Psychiatry Consult Evaluation  Service Date: November 26, 2023 LOS:  LOS: 0 days  Chief Complaint "My mood is all over the place".  Primary Psychiatric Diagnoses  Alcohol induced mood disorder   Assessment  Maurice Murray is a 52 y.o. male admitted: Medicallyfor 11/26/2023  3:46 AM for alcohol detox with complications. He carries the psychiatric diagnoses of alcohol induced mood disorder and has a past medical history of acute hyperactive alcohol withdrawal delirium and HTN.   His current presentation of depression and anxiety being "up and down", anhedonia, low motivation, worthlessness, low energy are most consistent with alcohol induced mood disorder. He meets criteria for alcohol induced mood disorder based on the symptoms listed above.  Current outpatient psychotropic medications include Zoloft and historically he has had a poor response to these medications. He was not compliant with medications prior to admission as evidenced by taking less than prescribed. On initial examination, patient is depressed and anxious. Please see plan below for detailed recommendations.   Diagnoses:  Active Hospital problems: Principal Problem:   Acute hyperactive alcohol withdrawal delirium (HCC) Active Problems:   Alcoholic hepatitis   Alcohol withdrawal (HCC)   Alcohol-induced mood disorder (HCC)    Plan   ## Psychiatric Medication Recommendations:  Alcohol induced mood disorder: Ativan and  phenobarbital detox protocol Discontinued Zoloft, non taking it prior to admission as it made him "angry" Started Abilify 2 mg daily  ## Medical Decision Making Capacity: Not specifically addressed in this encounter  ## Further Work-up:  -- None   ## Disposition:-- Recommend rehab which the client is agreeable  ## Behavioral / Environmental: - No specific recommendations at this time.     ## Safety and Observation Level:  - Based on my clinical evaluation, I estimate the patient to be at low risk of self harm in the current setting. - At this time, we recommend  routine. This decision is based on my review of the chart including patient's history and current presentation, interview of the patient, mental status examination, and consideration of suicide risk including evaluating suicidal ideation, plan, intent, suicidal or self-harm behaviors, risk factors, and protective factors. This judgment is based on our ability to directly address suicide risk, implement suicide prevention strategies, and develop a safety plan while the patient is in the clinical setting. Please contact our team if there is a concern that risk level has changed.  CSSR Risk Category:C-SSRS RISK CATEGORY: No Risk  Suicide Risk Assessment: Patient has following modifiable risk factors for suicide: under treated depression  and medication noncompliance, which we are addressing by starting medication. Patient has following non-modifiable or demographic risk factors for suicide: male gender Patient has the following protective factors against suicide: Supportive friends, no history of suicide attempts, and no history of NSSIB  Thank you for this consult request. Recommendations have been communicated to  the primary team.  We will continue to manage his medications and recommend rehab when medically cleared at this time.   [name], NP       History of Present Illness  Relevant Aspects of [facility]:   Admitted on 11/26/2023 for alcohol detox. They have been drinking 3 bottles a wine per day and "drinking my whole life".  He does have a history of complications from detox including DTs, currently he is medically admitted.  A consult was placed for his Zoloft being ineffective.  The client was not taking it as it made him angry.  His depression and anxiety are "all over the place, up and down", denies suicidal ideations and past suicide attempts. This provider will try Ability to assist with his mood since it is in a different class to assist.  Denies hallucinations and withdrawal symptoms on assessment "but I just had a shot", currently on phenobarbital and Ativan. He has been to a few rehabs:  [facility], [facility], and [place].  He was doing a program at [facility] bt stopped going.  He does live with a roommate which is not going well as "he's on something".  Maurice Murray is interested in returning to rehab once he is medically cleared.  Patient Report:  "My mood is up and down".  Psych ROS:  Depression: moderate overall Anxiety:  moderate overall Mania (lifetime and current): none Psychosis: (lifetime and current): none  Collateral information:  None  Review of Systems  All other systems reviewed and are negative.    Psychiatric and Social History  Psychiatric History:  Information collected from patient and chart  Prev Dx/Sx: depression, anxiety, alcohol use d/o Current Psych Provider: [facility] Home Meds (current): recently on Zoloft Previous Med Trials: Zoloft Therapy: none currently  Prior Psych Hospitalization: 3 rehabs and alcohol hospitalizations/medicl Prior Self Harm: none Prior Violence: none  Family Psych History: none Family Hx suicide: none  Social History:  Legal Hx: Denies Living Situation: lives with a roommate Access to weapons/lethal means: denies   Substance History Alcohol: 3 bottles of wine per day  Type of alcohol wine Last Drink 6 hours prior to  admission Number of drinks per day 12 glasses of wine daily History of alcohol withdrawal seizures yes History of DT's yes Tobacco: yes Illicit drugs: denies Prescription drug abuse: denies Rehab hx: 3  Exam Findings  Physical Exam: WDL Vital Signs:  Temp:  [98.6 F (37 C)-98.8 F (37.1 C)] 98.8 F (37.1 C) (12/25 1148) Pulse Rate:  [96-140] 111 (12/25 1148) Resp:  [16-22] 18 (12/25 1148) BP: (113-141)/(82-100) 129/82 (12/25 1148) SpO2:  [91 %-100 %] 95 % (12/25 1148) Weight:  [81.6 kg] 81.6 kg (12/25 0345) Blood pressure 129/82, pulse (!) 111, temperature 98.8 F (37.1 C), temperature source Oral, resp. rate 18, height 6\' 1"  (1.854 m), weight 81.6 kg, SpO2 95%. Body mass index is 23.75 kg/m.  Physical Exam Vitals and nursing note reviewed.  Psychiatric:        Attention and Perception: Attention and perception normal.        Mood and Affect: Mood is anxious and depressed.        Speech: Speech normal.        Behavior: Behavior normal. Behavior is cooperative.        Thought Content: Thought content normal.        Cognition and Memory: Cognition and memory normal.        Judgment: Judgment normal.  Mental Status Exam: General Appearance: Casual  Orientation:  Full (Time, Place, and Person)  Memory:  Immediate;   Fair Recent;   Fair Remote;   Fair  Concentration:  Concentration: Good and Attention Span: Good  Recall:  Good  Attention  Good  Eye Contact:  Good  Speech:  Clear and Coherent  Language:  Good  Volume:  Normal  Mood: depressed, anxious  Affect:  Congruent  Thought Process:  Coherent  Thought Content:  Logical  Suicidal Thoughts:  No  Homicidal Thoughts:  No  Judgement:  Fair  Insight:  Fair  Psychomotor Activity:  Decreased  Akathisia:  No  Fund of Knowledge:  Fair      Assets:  Housing Leisure Time Physical Health Resilience  Cognition:  WNL  ADL's:  Intact  AIMS (if indicated):        Other History   These have been pulled in  through the EMR, reviewed, and updated if appropriate.  Family History:  The patient's family history includes Alcohol abuse in his maternal grandmother and mother; Cirrhosis in his mother; Crohn's disease in his sister; Healthy in his daughter and father; Heart attack (age of onset: 55) in his paternal grandfather; Heart disease in his maternal grandfather; Hypertension in his maternal grandfather and paternal grandfather; Lung cancer in his paternal grandmother.  Medical History: Past Medical History:  Diagnosis Date   Anxiety    Depression    GERD (gastroesophageal reflux disease)    Hypertension     Surgical History: Past Surgical History:  Procedure Laterality Date   GALLBLADDER SURGERY Right 2020   NECK SURGERY Left 01/30/2023   cyst removal     Medications:   Current Facility-Administered Medications:    amLODipine (NORVASC) tablet 5 mg, 5 mg, Oral, Daily, Mikey College T, MD, 5 mg at 11/26/23 0919   ibuprofen (ADVIL) tablet 400 mg, 400 mg, Oral, Q6H PRN, Emeline General, MD   LORazepam (ATIVAN) injection 0-4 mg, 0-4 mg, Intravenous, Q6H, Irean Hong, MD, 1 mg at 11/26/23 1154   LORazepam (ATIVAN) injection 1 mg, 1 mg, Intravenous, Q4H PRN, Irean Hong, MD   ondansetron [facility]) injection 4 mg, 4 mg, Intravenous, Q6H PRN, Emeline General, MD   pantoprazole (PROTONIX) EC tablet 40 mg, 40 mg, Oral, Daily, Mikey College T, MD, 40 mg at 11/26/23 0919   PHENobarbital (LUMINAL) tablet 97.2 mg, 97.2 mg, Oral, Q8H **FOLLOWED BY** [START ON 11/28/2023] PHENobarbital (LUMINAL) tablet 64.8 mg, 64.8 mg, Oral, Q8H **FOLLOWED BY** [START ON 11/30/2023] PHENobarbital (LUMINAL) tablet 32.4 mg, 32.4 mg, Oral, Q8H, Sung, Jade J, MD   sertraline (ZOLOFT) tablet 100 mg, 100 mg, Oral, Daily, Chipper Herb, Ping T, MD, 100 mg at 11/26/23 1308   thiamine (VITAMIN B1) tablet 100 mg, 100 mg, Oral, Daily, Dolores Frame, Jade J, MD, 100 mg at 11/26/23 6578  Current Outpatient Medications:    amLODipine (NORVASC) 5 MG  tablet, Take 1 tablet (5 mg total) by mouth daily., Disp: 90 tablet, Rfl: 1   cyanocobalamin (VITAMIN B12) 500 MCG tablet, Take 1 tablet (500 mcg total) by mouth daily., Disp: 30 tablet, Rfl: 3   esomeprazole (NEXIUM) 40 MG capsule, Take 1 capsule (40 mg total) by mouth daily., Disp: 90 capsule, Rfl: 1   sertraline (ZOLOFT) 50 MG tablet, Take 1 tablet (50 mg total) by mouth daily., Disp: 14 tablet, Rfl: 0  Allergies: No Known Allergies  [name], NP

## 2023-11-26 NOTE — H&P (Addendum)
History and Physical    [name] CZY:[id number] DOB: Jul 20, 1971 DOA: 11/26/2023  PCP: Pcp, No (Confirm with patient/family/NH records and if not entered, this has to be entered at [facility] point of entry) Patient coming from: Home  I have personally briefly reviewed patient's old medical records in [facility]  Chief Complaint: Withdrawing from alcohol  HPI: Maurice Murray is a 52 y.o. male with medical history significant of alcohol abuse, anxiety/depression, GERD, HTN, presented with withdrawal from alcohol.  Patient does binge drinking, last drink was yesterday evening to 3 bottles 1 and this morning patient became nausea and started to feel tremors palpitations headache and seeing shadows in the room.  Denied any chest pain no abdominal pain no diarrhea no fever or chills.  Denied any fall or loss of consciousness at home. ED Course: Tachycardia heart rate 140, blood pressure 130/90, O2 saturation 97% on room air.  Blood work showed AST 638, ALT 27, bilirubin 1.4, platelet 54.  Patient was started on CIWA protocol, but continued to be tachycardic and eventually started on phenobarbital tapering.  Review of Systems: As per HPI otherwise 14 point review of systems negative.    Past Medical History:  Diagnosis Date   Anxiety    Depression    GERD (gastroesophageal reflux disease)    Hypertension     Past Surgical History:  Procedure Laterality Date   GALLBLADDER SURGERY Right 2020   NECK SURGERY Left 01/30/2023   cyst removal     reports that he has been smoking cigarettes. He has a 15 pack-year smoking history. He uses smokeless tobacco. He reports current alcohol use. He reports that he does not use drugs.  No Known Allergies  Family History  Problem Relation Age of Onset   Alcohol abuse Mother    Cirrhosis Mother    Healthy Father    Crohn's disease Sister    Healthy Daughter    Alcohol abuse Maternal Grandmother    Hypertension Maternal Grandfather    Heart  disease Maternal Grandfather    Lung cancer Paternal Grandmother        small cell   Hypertension Paternal Grandfather    Heart attack Paternal Grandfather 40     Prior to Admission medications   Medication Sig Start Date End Date Taking? Authorizing Provider  amLODipine (NORVASC) 5 MG tablet Take 1 tablet (5 mg total) by mouth daily. 05/21/23  Yes Iloabachie, Chioma E, NP  cyanocobalamin (VITAMIN B12) 500 MCG tablet Take 1 tablet (500 mcg total) by mouth daily. 05/22/23   Iloabachie, Chioma E, NP  esomeprazole (NEXIUM) 40 MG capsule Take 1 capsule (40 mg total) by mouth daily. 01/15/23   Iloabachie, Chioma E, NP  sertraline (ZOLOFT) 50 MG tablet Take 1 tablet (50 mg total) by mouth daily. 08/21/23   Rolm Gala, NP    Physical Exam: Vitals:   11/26/23 0530 11/26/23 0600 11/26/23 0630 11/26/23 0700  BP: 121/87 (!) 129/92 (!) 125/92 113/86  Pulse: (!) 109 96 99 (!) 108  Resp: 17 17 18 16   Temp:      SpO2: 99% 99% 92% 91%  Weight:      Height:        Constitutional: NAD, calm, comfortable Vitals:   11/26/23 0530 11/26/23 0600 11/26/23 0630 11/26/23 0700  BP: 121/87 (!) 129/92 (!) 125/92 113/86  Pulse: (!) 109 96 99 (!) 108  Resp: 17 17 18 16   Temp:      SpO2: 99% 99% 92%  91%  Weight:      Height:       Eyes: PERRL, lids and conjunctivae normal ENMT: Mucous membranes are moist. Posterior pharynx clear of any exudate or lesions.Normal dentition.  Neck: normal, supple, no masses, no thyromegaly Respiratory: clear to auscultation bilaterally, no wheezing, no crackles. Normal respiratory effort. No accessory muscle use.  Cardiovascular: Regular rate and rhythm, no murmurs / rubs / gallops. No extremity edema. 2+ pedal pulses. No carotid bruits.  Abdomen: no tenderness, no masses palpated. No hepatosplenomegaly. Bowel sounds positive.  Musculoskeletal: no clubbing / cyanosis. No joint deformity upper and lower extremities. Good ROM, no contractures. Normal muscle tone.   Skin: no rashes, lesions, ulcers. No induration Neurologic: CN 2-12 grossly intact. Sensation intact, DTR normal. Strength 5/5 in all 4.  Fine tremors on bilateral fingertips and tongue Psychiatric: Very nervous normal judgment and insight. Alert and oriented x 3. Normal mood.     Labs on Admission: I have personally reviewed following labs and imaging studies  CBC: Recent Labs  Lab 11/26/23 0401  WBC 8.2  HGB 15.3  HCT 44.0  MCV 96.3  PLT 54*   Basic Metabolic Panel: Recent Labs  Lab 11/26/23 0401  NA 136  K 3.7  CL 97*  CO2 23  GLUCOSE 136*  BUN 12  CREATININE 0.68  CALCIUM 7.9*   GFR: Estimated Creatinine Clearance: 124.8 mL/min (by C-G formula based on SCr of 0.68 mg/dL). Liver Function Tests: Recent Labs  Lab 11/26/23 0401  AST 630*  ALT 287*  ALKPHOS 70  BILITOT 1.4*  PROT 6.4*  ALBUMIN 4.0   Recent Labs  Lab 11/26/23 0401  LIPASE 44   No results for input(s): "AMMONIA" in the last 168 hours. Coagulation Profile: No results for input(s): "INR", "PROTIME" in the last 168 hours. Cardiac Enzymes: No results for input(s): "CKTOTAL", "CKMB", "CKMBINDEX", "TROPONINI" in the last 168 hours. BNP (last 3 results) No results for input(s): "PROBNP" in the last 8760 hours. HbA1C: No results for input(s): "HGBA1C" in the last 72 hours. CBG: No results for input(s): "GLUCAP" in the last 168 hours. Lipid Profile: No results for input(s): "CHOL", "HDL", "LDLCALC", "TRIG", "CHOLHDL", "LDLDIRECT" in the last 72 hours. Thyroid Function Tests: No results for input(s): "TSH", "T4TOTAL", "FREET4", "T3FREE", "THYROIDAB" in the last 72 hours. Anemia Panel: No results for input(s): "VITAMINB12", "FOLATE", "FERRITIN", "TIBC", "IRON", "RETICCTPCT" in the last 72 hours. Urine analysis: No results found for: "COLORURINE", "APPEARANCEUR", "LABSPEC", "PHURINE", "GLUCOSEU", "HGBUR", "BILIRUBINUR", "KETONESUR", "PROTEINUR", "UROBILINOGEN", "NITRITE",  "LEUKOCYTESUR"  Radiological Exams on Admission: No results found.  EKG: Independently reviewed.  Chronic RBBB with nonspecific ST changes on multiple leads  Assessment/Plan Principal Problem:   Acute hyperactive alcohol withdrawal delirium (HCC) Active Problems:   Alcoholic hepatitis   Alcohol withdrawal (HCC)  (please populate well all problems here in Problem List. (For example, if patient is on BP meds at home and you resume or decide to hold them, it is a problem that needs to be her. Same for CAD, COPD, HLD and so on)  Alcohol intoxication and alcohol withdrawal Delirium secondary to alcohol withdrawal -Still frequent breakthrough symptoms including tremors and visual hallucinations even on CIWA protocol and had open dose, agreed with phenobarbital tapering protocol -Banana bag x1 day -Admit to PCU for close monitoring  Acute alcoholic hepatitis -Estimated discriminant factor less than 32, no indication for steroid -Trend LFTs -No RUQ symptoms  Acute thrombocytopenia -H&H stable, denied any rectal bleeding -Check INR -Hold off chemical DVT prophylaxis  Anxiety/depression -Patient reported that feeling depressed but denied any suicidal ideation.  Will increase his Zoloft dosage.  Consult psychiatry team  HTN -Continue amlodipine  GERD -Continue PPI  DVT prophylaxis: SCD Code Status: Full code Family Communication: None at bedside Disposition Plan: Patient sick with significant alcohol withdrawal and delirium, requiring inpatient alcohol detoxification protocol, and close monitoring, expect more than 2 midnight hospital stay Consults called: Psychiatry Admission status: PCU admit   Emeline General MD [facility] Pager [phone]  11/26/2023, 7:57 AM

## 2023-11-26 NOTE — ED Provider Notes (Signed)
[facility] Provider Note    Event Date/Time   First MD Initiated Contact with Patient 11/26/23 [phone]     (approximate)   History   Withdrawal   HPI  Maurice Murray is a 52 y.o. male brought to the ED via EMS from home with a chief complaint of alcohol withdrawals.  Patient states he has been drinking "all my life", worse in the past 10 years.  Drinks beer, wine, liquor daily.  Drank 3 bottles of wine yesterday, last drink 6 hours ago.  History of DTs.  Presents for tremors, palpitations, headache, visual hallucinations of seeing shadows.  Denies chest pain, shortness of breath, abdominal pain, nausea, vomiting or dizziness.     Past Medical History   Past Medical History:  Diagnosis Date   Anxiety    Depression    GERD (gastroesophageal reflux disease)    Hypertension      Active Problem List   Patient Active Problem List   Diagnosis Date Noted   Acute hyperactive alcohol withdrawal delirium (HCC) 11/26/2023   Ingrown nail of great toe 01/15/2023   Cyst of skin 01/15/2023   History of umbilical hernia 05/23/2022   Encounter to establish care 08/15/2020   Essential hypertension 08/15/2020   History of depression 08/15/2020   Smoking 08/15/2020   Abdominal pain 08/15/2020     Past Surgical History   Past Surgical History:  Procedure Laterality Date   GALLBLADDER SURGERY Right 2020   NECK SURGERY Left 01/30/2023   cyst removal     Home Medications   Prior to Admission medications   Medication Sig Start Date End Date Taking? Authorizing Provider  amLODipine (NORVASC) 5 MG tablet Take 1 tablet (5 mg total) by mouth daily. 05/21/23  Yes Iloabachie, Chioma E, NP  cyanocobalamin (VITAMIN B12) 500 MCG tablet Take 1 tablet (500 mcg total) by mouth daily. 05/22/23   Iloabachie, Chioma E, NP  esomeprazole (NEXIUM) 40 MG capsule Take 1 capsule (40 mg total) by mouth daily. 01/15/23   Iloabachie, Chioma E, NP  sertraline (ZOLOFT) 50 MG tablet  Take 1 tablet (50 mg total) by mouth daily. 08/21/23   Iloabachie, Chioma E, NP     Allergies  Patient has no known allergies.   Family History   Family History  Problem Relation Age of Onset   Alcohol abuse Mother    Cirrhosis Mother    Healthy Father    Crohn's disease Sister    Healthy Daughter    Alcohol abuse Maternal Grandmother    Hypertension Maternal Grandfather    Heart disease Maternal Grandfather    Lung cancer Paternal Grandmother        small cell   Hypertension Paternal Grandfather    Heart attack Paternal Grandfather 20     Physical Exam  Triage Vital Signs: ED Triage Vitals  Encounter Vitals Group     BP 11/26/23 0344 (!) 133/97     Systolic BP Percentile --      Diastolic BP Percentile --      Pulse Rate 11/26/23 0344 (!) 140     Resp 11/26/23 0344 20     Temp 11/26/23 0344 98.6 F (37 C)     Temp src --      SpO2 11/26/23 0344 97 %     Weight 11/26/23 0345 180 lb (81.6 kg)     Height 11/26/23 0345 6\' 1"  (1.854 m)     Head Circumference --  Peak Flow --      Pain Score 11/26/23 0344 10     Pain Loc --      Pain Education --      Exclude from Growth Chart --     Updated Vital Signs: BP (!) 129/92   Pulse 96   Temp 98.6 F (37 C)   Resp 17   Ht 6\' 1"  (1.854 m)   Wt 81.6 kg   SpO2 99%   BMI 23.75 kg/m    General: Awake, moderate distress.  CV:  Tachycardic.  Good peripheral perfusion.  Resp:  Normal effort.  CTAB. Abd:  Nontender.  No distention.  Other:  Asterixis.  Alert and oriented x 3.  CN II-XII grossly intact.  5/5 motor strength and sensation all extremities.  MAE x 4.   ED Results / Procedures / Treatments  Labs (all labs ordered are listed, but only abnormal results are displayed) Labs Reviewed  CBC - Abnormal; Notable for the following components:      Result Value   Platelets 54 (*)    All other components within normal limits  COMPREHENSIVE METABOLIC PANEL - Abnormal; Notable for the following components:    Chloride 97 (*)    Glucose, Bld 136 (*)    Calcium 7.9 (*)    Total Protein 6.4 (*)    AST 630 (*)    ALT 287 (*)    Total Bilirubin 1.4 (*)    Anion gap 16 (*)    All other components within normal limits  ETHANOL - Abnormal; Notable for the following components:   Alcohol, Ethyl (B) 346 (*)    All other components within normal limits  LIPASE, BLOOD     EKG  ED ECG REPORT I, Syria Kestner J, the attending physician, personally viewed and interpreted this ECG.   Date: 11/26/2023  EKG Time: 0358  Rate: 130  Rhythm: sinus tachycardia  Axis: Normal  Intervals:right bundle branch block  ST&T Change: Nonspecific    RADIOLOGY  None   Official radiology report(s): No results found.   PROCEDURES:  Critical Care performed: Yes, see critical care procedure note(s)  CRITICAL CARE Performed by: Irean Hong   Total critical care time: 30 minutes  Critical care time was exclusive of separately billable procedures and treating other patients.  Critical care was necessary to treat or prevent imminent or life-threatening deterioration.  Critical care was time spent personally by me on the following activities: development of treatment plan with patient and/or surrogate as well as nursing, discussions with consultants, evaluation of patient's response to treatment, examination of patient, obtaining history from patient or surrogate, ordering and performing treatments and interventions, ordering and review of laboratory studies, ordering and review of radiographic studies, pulse oximetry and re-evaluation of patient's condition.   Marland Kitchen1-3 Lead EKG Interpretation  Performed by: Irean Hong, MD Authorized by: Irean Hong, MD     Interpretation: abnormal     ECG rate:  130   ECG rate assessment: tachycardic     Rhythm: sinus tachycardia     Ectopy: none     Conduction: normal   Comments:     Patient placed on cardiac monitor to evaluate for arrhythmias    MEDICATIONS  ORDERED IN ED: Medications  thiamine (VITAMIN B1) tablet 100 mg (has no administration in time range)  LORazepam (ATIVAN) injection 0-4 mg (2 mg Intravenous Given 11/26/23 0420)  PHENObarbital (LUMINAL) injection 130 mg (has no administration in time range)  LORazepam (ATIVAN)  injection 1 mg (has no administration in time range)  PHENobarbital (LUMINAL) tablet 97.2 mg (has no administration in time range)    Followed by  PHENobarbital (LUMINAL) tablet 64.8 mg (has no administration in time range)    Followed by  PHENobarbital (LUMINAL) tablet 32.4 mg (has no administration in time range)  sodium chloride 0.9 % bolus 1,000 mL (1,000 mLs Intravenous New Bag/Given 11/26/23 0409)     IMPRESSION / MDM / ASSESSMENT AND PLAN / ED COURSE  I reviewed the triage vital signs and the nursing notes.                             52 year old male presenting with alcohol withdrawal symptoms.  He is tachycardic, hypertensive with tremors.  Will place on CIWA, initiate IV fluid resuscitation.  Will closely monitor, anticipate hospitalization.  Patient's presentation is most consistent with acute presentation with potential threat to life or bodily function.  The patient is on the cardiac monitor to evaluate for evidence of arrhythmia and/or significant heart rate changes.    Clinical Course as of 11/26/23 1610  Wed Nov 26, 2023  0544 EtOH 346.  Patient remains tachycardic, hypertensive and Trembley after IV Ativan.  Will consult hospital services for evaluation and admission. [JS]    Clinical Course User Index [JS] Irean Hong, MD     FINAL CLINICAL IMPRESSION(S) / ED DIAGNOSES   Final diagnoses:  Alcohol withdrawal syndrome with complication [facility])     Rx / DC Orders   ED Discharge Orders     None        Note:  This document was prepared using Dragon voice recognition software and may include unintentional dictation errors.   Irean Hong, MD 11/26/23 [phone]

## 2023-11-26 NOTE — ED Notes (Signed)
Pt given warm blanket.

## 2023-11-26 NOTE — ED Notes (Signed)
Pt c/o increasing tremors

## 2023-11-27 DIAGNOSIS — F1094 Alcohol use, unspecified with alcohol-induced mood disorder: Secondary | ICD-10-CM

## 2023-11-27 DIAGNOSIS — K760 Fatty (change of) liver, not elsewhere classified: Secondary | ICD-10-CM

## 2023-11-27 DIAGNOSIS — K7010 Alcoholic hepatitis without ascites: Secondary | ICD-10-CM

## 2023-11-27 DIAGNOSIS — F10931 Alcohol use, unspecified with withdrawal delirium: Secondary | ICD-10-CM | POA: Diagnosis not present

## 2023-11-27 DIAGNOSIS — D696 Thrombocytopenia, unspecified: Secondary | ICD-10-CM | POA: Diagnosis not present

## 2023-11-27 DIAGNOSIS — K219 Gastro-esophageal reflux disease without esophagitis: Secondary | ICD-10-CM

## 2023-11-27 LAB — HEPATIC FUNCTION PANEL
ALT: 196 U/L — ABNORMAL HIGH (ref 0–44)
AST: 282 U/L — ABNORMAL HIGH (ref 15–41)
Albumin: 3.5 g/dL (ref 3.5–5.0)
Alkaline Phosphatase: 69 U/L (ref 38–126)
Bilirubin, Direct: 0.6 mg/dL — ABNORMAL HIGH (ref 0.0–0.2)
Indirect Bilirubin: 2 mg/dL — ABNORMAL HIGH (ref 0.3–0.9)
Total Bilirubin: 2.6 mg/dL — ABNORMAL HIGH (ref <1.2)
Total Protein: 5.9 g/dL — ABNORMAL LOW (ref 6.5–8.1)

## 2023-11-27 LAB — CBC
HCT: 34.5 % — ABNORMAL LOW (ref 39.0–52.0)
Hemoglobin: 12.3 g/dL — ABNORMAL LOW (ref 13.0–17.0)
MCH: 33.6 pg (ref 26.0–34.0)
MCHC: 35.7 g/dL (ref 30.0–36.0)
MCV: 94.3 fL (ref 80.0–100.0)
Platelets: 34 K/uL — ABNORMAL LOW (ref 150–400)
RBC: 3.66 MIL/uL — ABNORMAL LOW (ref 4.22–5.81)
RDW: 12.3 % (ref 11.5–15.5)
WBC: 5.4 K/uL (ref 4.0–10.5)
nRBC: 0 % (ref 0.0–0.2)

## 2023-11-27 NOTE — Assessment & Plan Note (Addendum)
Likely secondary to bone marrow suppression with alcohol.  Check hepatitis C and HIV and peripheral smear.

## 2023-11-27 NOTE — Assessment & Plan Note (Signed)
Psychiatry placed on Abilify

## 2023-11-27 NOTE — Assessment & Plan Note (Signed)
Liver function test trending better.  AST 282, ALT 196, total bilirubin still elevated at 2.6 and alkaline phosphatase 6.9

## 2023-11-27 NOTE — Plan of Care (Signed)

## 2023-11-27 NOTE — Assessment & Plan Note (Signed)
Patient on phenobarbital and Ativan withdrawal protocol.  Continue multivitamin and folic acid and thiamine.  Patient interested in stopping drinking and going to rehab for alcohol.

## 2023-11-27 NOTE — Assessment & Plan Note (Signed)
On Nexium as outpatient.  On Protonix here.

## 2023-11-27 NOTE — Assessment & Plan Note (Signed)
Seen on outpatient liver ultrasound in July 2024.

## 2023-11-27 NOTE — Progress Notes (Addendum)
CCMD called and reported Heart rate up at 159. Staff RN went to check patient. Patient was heading to the door and told staff RN " I am leaving, my daughter had an accident and is on the side of the road. Staff RN callled Father. Father called and wants to speak with the son. Called transfer to pt room. NP Jon Billings made aware and came and assesed pt. Will continue to monitor.  Update 2258: Pt decided to stay in the hospital. NP Jon Billings made aware. Will continue to monitor.

## 2023-11-27 NOTE — Hospital Course (Signed)
52 year old man past medical history of alcohol abuse, anxiety depression, GERD, hypertension presented with withdrawal of alcohol.  Patient binge drinks and he drank 3 bottles of wine prior the day before coming into the hospital.  Started feeling tremors and palpitations and headache and seeing shadows in the room.  12/26.  Patient feeling better today.  Patient on phenobarbital and Ativan withdrawal protocol.

## 2023-11-27 NOTE — Progress Notes (Signed)
  Progress Note   Patient: Maurice Murray:[id number] DOB: 01-07-1971 DOA: 11/26/2023     1 DOS: the patient was seen and examined on 11/27/2023   Brief hospital course: 52 year old man past medical history of alcohol abuse, anxiety depression, GERD, hypertension presented with withdrawal of alcohol.  Patient binge drinks and he drank 3 bottles of wine prior the day before coming into the hospital.  Started feeling tremors and palpitations and headache and seeing shadows in the room.  12/26.  Patient feeling better today.  Patient on phenobarbital and Ativan withdrawal protocol.    Assessment and Plan: * Acute hyperactive alcohol withdrawal delirium (HCC) Patient on phenobarbital and Ativan withdrawal protocol.  Continue multivitamin and folic acid and thiamine.  Patient interested in stopping drinking and going to rehab for alcohol.  Alcoholic hepatitis Liver function test trending better.  AST 282, ALT 196, total bilirubin still elevated at 2.6 and alkaline phosphatase 6.9  Hepatic steatosis Seen on outpatient liver ultrasound in July 2024.  Thrombocytopenia (HCC) Likely secondary to bone marrow suppression with alcohol.  Check hepatitis C and HIV and peripheral smear.  GERD (gastroesophageal reflux disease) On Nexium as outpatient.  On Protonix here.  Alcohol-induced mood disorder [facility]) Psychiatry placed on Abilify        Subjective: Patient feeling better today.  Offers no complaints.  Feels a little less shaky today.  Patient on phenobarbital and Ativan withdrawal protocol.  Physical Exam: Vitals:   11/27/23 0022 11/27/23 0512 11/27/23 0828 11/27/23 1245  BP: 116/89 109/72 117/89 (!) 113/93  Pulse: 98 92 96 90  Resp: 18 18 16 19   Temp: 99.1 F (37.3 C) 98.2 F (36.8 C) 98.3 F (36.8 C) 98 F (36.7 C)  TempSrc:    Oral  SpO2: 97% 100% 97% 98%  Weight:      Height:       Physical Exam HENT:     Head: Normocephalic.  Eyes:     General: Lids are normal.      Conjunctiva/sclera: Conjunctivae normal.  Cardiovascular:     Rate and Rhythm: Normal rate and regular rhythm.     Heart sounds: Normal heart sounds, S1 normal and S2 normal.  Pulmonary:     Breath sounds: No decreased breath sounds, wheezing, rhonchi or rales.  Abdominal:     Palpations: Abdomen is soft.     Tenderness: There is no abdominal tenderness.  Musculoskeletal:     Right lower leg: No swelling.     Left lower leg: No swelling.  Skin:    General: Skin is warm.     Findings: No rash.  Neurological:     Mental Status: He is alert and oriented to person, place, and time.     Comments: Slight tremor seen.     Data Reviewed: AST 282, ALT 196, total bilirubin 2.6, hemoglobin 12.3, platelet count 34  Disposition: Status is: Inpatient Remains inpatient appropriate because: Continue alcohol withdrawal protocol here with phenobarbital and Ativan.  Make sure platelets stabilize.  Check HIV, hepatitis C and peripheral smear and platelet count tomorrow.  Planned Discharge Destination: Home    Time spent: 28 minutes  Author: Alford Highland, MD 11/27/2023 2:23 PM  For on call review [email or web address].

## 2023-11-28 LAB — TECHNOLOGIST SMEAR REVIEW: Plt Morphology: DECREASED

## 2023-11-28 LAB — HEPATIC FUNCTION PANEL
ALT: 171 U/L — ABNORMAL HIGH (ref 0–44)
AST: 211 U/L — ABNORMAL HIGH (ref 15–41)
Albumin: 3.7 g/dL (ref 3.5–5.0)
Alkaline Phosphatase: 72 U/L (ref 38–126)
Bilirubin, Direct: 0.4 mg/dL — ABNORMAL HIGH (ref 0.0–0.2)
Indirect Bilirubin: 1.3 mg/dL — ABNORMAL HIGH (ref 0.3–0.9)
Total Bilirubin: 1.7 mg/dL — ABNORMAL HIGH (ref <1.2)
Total Protein: 6.3 g/dL — ABNORMAL LOW (ref 6.5–8.1)

## 2023-11-28 LAB — CBC WITH DIFFERENTIAL/PLATELET
Abs Immature Granulocytes: 0.04 10*3/uL (ref 0.00–0.07)
Basophils Absolute: 0 10*3/uL (ref 0.0–0.1)
Basophils Relative: 0 %
Eosinophils Absolute: 0.2 10*3/uL (ref 0.0–0.5)
Eosinophils Relative: 3 %
HCT: 35.1 % — ABNORMAL LOW (ref 39.0–52.0)
Hemoglobin: 12.4 g/dL — ABNORMAL LOW (ref 13.0–17.0)
Immature Granulocytes: 1 %
Lymphocytes Relative: 29 %
Lymphs Abs: 1.6 10*3/uL (ref 0.7–4.0)
MCH: 33.9 pg (ref 26.0–34.0)
MCHC: 35.3 g/dL (ref 30.0–36.0)
MCV: 95.9 fL (ref 80.0–100.0)
Monocytes Absolute: 0.4 10*3/uL (ref 0.1–1.0)
Monocytes Relative: 7 %
Neutro Abs: 3.3 10*3/uL (ref 1.7–7.7)
Neutrophils Relative %: 60 %
Platelets: 37 10*3/uL — ABNORMAL LOW (ref 150–400)
RBC: 3.66 MIL/uL — ABNORMAL LOW (ref 4.22–5.81)
RDW: 12.3 % (ref 11.5–15.5)
WBC: 5.4 10*3/uL (ref 4.0–10.5)
nRBC: 0 % (ref 0.0–0.2)

## 2023-11-28 LAB — HIV ANTIBODY (ROUTINE TESTING W REFLEX): HIV Screen 4th Generation wRfx: NONREACTIVE

## 2023-11-28 LAB — HEPATITIS C ANTIBODY: HCV Ab: NONREACTIVE

## 2023-11-28 NOTE — Plan of Care (Signed)
  Problem: Education: Goal: Knowledge of General Education information will improve Description: Including pain rating scale, medication(s)/side effects and non-pharmacologic comfort measures Outcome: Progressing   Problem: Activity: Goal: Risk for activity intolerance will decrease Outcome: Progressing   Problem: Pain Management: Goal: General experience of comfort will improve Outcome: Progressing   Problem: Clinical Measurements: Goal: Will remain free from infection Outcome: Progressing

## 2023-11-28 NOTE — Progress Notes (Signed)
He keeps trying to leave. Stated he has something to do today. Dr. Mayford Knife notified, Okay with patient leaving AMA. AMA form signed and placed in chart. All belongings with patient.

## 2023-11-28 NOTE — Discharge Summary (Signed)
Physician Discharge Summary  Maurice Murray NGE:[id number] DOB: 10-16-1971 DOA: 11/26/2023  PCP: Oneita Hurt, No  Admit date: 11/26/2023 Discharge date: 11/28/2023  Admitted From: home  Disposition:  Pt left AMA   Recommendations for Outpatient Follow-up:  Pt left AMA   Home Health: no  Equipment/Devices:  Discharge Condition: guarded  CODE STATUS: full  Diet recommendation:   Brief/Interim Summary: HPI was taken from Dr. Christian Mate is a 52 y.o. male with medical history significant of alcohol abuse, anxiety/depression, GERD, HTN, presented with withdrawal from alcohol.   Patient does binge drinking, last drink was yesterday evening to 3 bottles 1 and this morning patient became nausea and started to feel tremors palpitations headache and seeing shadows in the room.  Denied any chest pain no abdominal pain no diarrhea no fever or chills.  Denied any fall or loss of consciousness at home. ED Course: Tachycardia heart rate 140, blood pressure 130/90, O2 saturation 97% on room air.  Blood work showed AST 638, ALT 27, bilirubin 1.4, platelet 54.   Patient was started on CIWA protocol, but continued to be tachycardic and eventually started on phenobarbital tapering.  Discharge Diagnoses:  Principal Problem:   Acute hyperactive alcohol withdrawal delirium (HCC) Active Problems:   Alcoholic hepatitis   Hepatic steatosis   Thrombocytopenia (HCC)   GERD (gastroesophageal reflux disease)   Alcohol-induced mood disorder (HCC)   Alcohol withdrawal (HCC)  As per Dr. Mayford Knife 11/28/23: Pt left AMA before this physician could see the  pt.    As per Dr. Renae Gloss: Acute hyperactive alcohol withdrawal delirium (HCC) Patient on phenobarbital and Ativan withdrawal protocol.  Continue multivitamin and folic acid and thiamine.  Patient interested in stopping drinking and going to rehab for alcohol.   Alcoholic hepatitis Liver function test trending better.  AST 282, ALT 196, total bilirubin  still elevated at 2.6 and alkaline phosphatase 6.9   Hepatic steatosis Seen on outpatient liver ultrasound in July 2024.   Thrombocytopenia (HCC) Likely secondary to bone marrow suppression with alcohol.  Check hepatitis C and HIV and peripheral smear.   GERD (gastroesophageal reflux disease) On Nexium as outpatient.  On Protonix here.   Alcohol-induced mood disorder [facility]) Psychiatry placed on Abilify       Discharge Instructions     No Known Allergies  Consultations:    Procedures/Studies: No results found. (Echo, Carotid, EGD, Colonoscopy, ERCP)    Subjective: Pt left AMA    Discharge Exam: Vitals:   11/28/23 0447 11/28/23 0806  BP: 119/88 (!) 125/100  Pulse: 95 98  Resp: 18   Temp: 99 F (37.2 C)   SpO2: 98% 98%   Vitals:   11/28/23 0003 11/28/23 0100 11/28/23 0447 11/28/23 0806  BP: (!) 157/82  119/88 (!) 125/100  Pulse:   95 98  Resp: 18  18   Temp: (!) 95.5 F (35.3 C) 97.6 F (36.4 C) 99 F (37.2 C)   TempSrc:      SpO2: 97%  98% 98%  Weight:      Height:        No PE was done as pt left AMA.     The results of significant diagnostics from this hospitalization (including imaging, microbiology, ancillary and laboratory) are listed below for reference.     Microbiology: No results found for this or any previous visit (from the past 240 hours).   Labs: BNP (last 3 results) No results for input(s): "BNP" in the last 8760 hours. Basic Metabolic Panel:  Recent Labs  Lab 11/26/23 0401  NA 136  K 3.7  CL 97*  CO2 23  GLUCOSE 136*  BUN 12  CREATININE 0.68  CALCIUM 7.9*   Liver Function Tests: Recent Labs  Lab 11/26/23 0401 11/27/23 0616 11/28/23 0543  AST 630* 282* 211*  ALT 287* 196* 171*  ALKPHOS 70 69 72  BILITOT 1.4* 2.6* 1.7*  PROT 6.4* 5.9* 6.3*  ALBUMIN 4.0 3.5 3.7   Recent Labs  Lab 11/26/23 0401  LIPASE 44   No results for input(s): "AMMONIA" in the last 168 hours. CBC: Recent Labs  Lab 11/26/23 0401  11/27/23 0616 11/28/23 0543  WBC 8.2 5.4 5.4  NEUTROABS  --   --  3.3  HGB 15.3 12.3* 12.4*  HCT 44.0 34.5* 35.1*  MCV 96.3 94.3 95.9  PLT 54* 34* 37*   Cardiac Enzymes: No results for input(s): "CKTOTAL", "CKMB", "CKMBINDEX", "TROPONINI" in the last 168 hours. BNP: Invalid input(s): "POCBNP" CBG: No results for input(s): "GLUCAP" in the last 168 hours. D-Dimer No results for input(s): "DDIMER" in the last 72 hours. Hgb A1c No results for input(s): "HGBA1C" in the last 72 hours. Lipid Profile No results for input(s): "CHOL", "HDL", "LDLCALC", "TRIG", "CHOLHDL", "LDLDIRECT" in the last 72 hours. Thyroid function studies No results for input(s): "TSH", "T4TOTAL", "T3FREE", "THYROIDAB" in the last 72 hours.  Invalid input(s): "FREET3" Anemia work up No results for input(s): "VITAMINB12", "FOLATE", "FERRITIN", "TIBC", "IRON", "RETICCTPCT" in the last 72 hours. Urinalysis No results found for: "COLORURINE", "APPEARANCEUR", "LABSPEC", "PHURINE", "GLUCOSEU", "HGBUR", "BILIRUBINUR", "KETONESUR", "PROTEINUR", "UROBILINOGEN", "NITRITE", "LEUKOCYTESUR" Sepsis Labs Recent Labs  Lab 11/26/23 0401 11/27/23 0616 11/28/23 0543  WBC 8.2 5.4 5.4   Microbiology No results found for this or any previous visit (from the past 240 hours).   Time coordinating discharge: Over 30 minutes  SIGNED:   Charise Killian, MD  [facility] 11/28/2023, 9:34 AM Pager   If 7PM-7AM, please contact night-coverage [email or web address]

## 2024-01-05 ENCOUNTER — Encounter: Payer: Self-pay | Admitting: Family Medicine

## 2024-01-05 ENCOUNTER — Ambulatory Visit: Payer: MEDICAID | Admitting: Family Medicine

## 2024-01-05 ENCOUNTER — Other Ambulatory Visit: Payer: Self-pay

## 2024-01-05 VITALS — BP 130/93 | HR 90 | Temp 98.4°F | Resp 16 | Ht 73.0 in | Wt 199.8 lb

## 2024-01-05 DIAGNOSIS — I10 Essential (primary) hypertension: Secondary | ICD-10-CM | POA: Diagnosis not present

## 2024-01-05 DIAGNOSIS — Z7689 Persons encountering health services in other specified circumstances: Secondary | ICD-10-CM

## 2024-01-05 DIAGNOSIS — K4090 Unilateral inguinal hernia, without obstruction or gangrene, not specified as recurrent: Secondary | ICD-10-CM

## 2024-01-05 DIAGNOSIS — F1090 Alcohol use, unspecified, uncomplicated: Secondary | ICD-10-CM

## 2024-01-05 DIAGNOSIS — F419 Anxiety disorder, unspecified: Secondary | ICD-10-CM

## 2024-01-05 DIAGNOSIS — K219 Gastro-esophageal reflux disease without esophagitis: Secondary | ICD-10-CM

## 2024-01-05 DIAGNOSIS — Z131 Encounter for screening for diabetes mellitus: Secondary | ICD-10-CM | POA: Insufficient documentation

## 2024-01-05 DIAGNOSIS — K709 Alcoholic liver disease, unspecified: Secondary | ICD-10-CM | POA: Diagnosis not present

## 2024-01-05 DIAGNOSIS — F17210 Nicotine dependence, cigarettes, uncomplicated: Secondary | ICD-10-CM | POA: Insufficient documentation

## 2024-01-05 DIAGNOSIS — F32A Depression, unspecified: Secondary | ICD-10-CM

## 2024-01-05 DIAGNOSIS — Z1211 Encounter for screening for malignant neoplasm of colon: Secondary | ICD-10-CM | POA: Insufficient documentation

## 2024-01-05 MED ORDER — AMLODIPINE BESYLATE 10 MG PO TABS
10.0000 mg | ORAL_TABLET | Freq: Every day | ORAL | 1 refills | Status: DC
  Filled 2024-01-05: qty 90, 90d supply, fill #0
  Filled 2024-02-08: qty 90, 90d supply, fill #1

## 2024-01-05 MED ORDER — SERTRALINE HCL 100 MG PO TABS
100.0000 mg | ORAL_TABLET | Freq: Every day | ORAL | 2 refills | Status: DC
  Filled 2024-01-05: qty 30, 30d supply, fill #0
  Filled 2024-02-02: qty 30, 30d supply, fill #1
  Filled 2024-03-02: qty 30, 30d supply, fill #2

## 2024-01-05 NOTE — Assessment & Plan Note (Signed)
Chronic, daily use 3/4 pack daily 35 years Willing to cut down to 12-13 cigarettes per day Declines further mgmt at this time due to focus on ETOH cessation

## 2024-01-05 NOTE — Assessment & Plan Note (Signed)
Chronic, stable Continue Sertraline 100mg  daily Referral to [facility], pt previously seen Pt plans to reach out on his end and see if he can schedule with provider F/u in 4 weeks

## 2024-01-05 NOTE — Assessment & Plan Note (Signed)
 Cologuard ordered

## 2024-01-05 NOTE — Assessment & Plan Note (Signed)
Continue Nexium 40 mg for GERD management Stable, chronic GI referral today given hx of alcoholic liver disease

## 2024-01-05 NOTE — Assessment & Plan Note (Signed)
Chronically elevated liver enzymes d/t ETOH use Referral to GI for further eval and potential mgmt

## 2024-01-05 NOTE — Assessment & Plan Note (Signed)
Chronic, elevated during visit = 130/93 GOAL <130/80 Increased Amlodipine to 10mg  daily  F/u in 4 week for BP check Check CMP today Low sodium diet, increase weekly exercise, decrease ETOH and tobacco use

## 2024-01-05 NOTE — Assessment & Plan Note (Addendum)
Recent relapse with cessation 10 days ago.  Currently on Naltrexone 50mg  daily.  Patient is motivated for recovery and has re-engaged with AA. -Continue Naltrexone 50mg  daily. -Refer to social work for additional resources. -Referral to behavioral health to [facility] and Encouraged patient to reestablish care with [facility]. Referral to [facility] - CMP checked

## 2024-01-05 NOTE — Assessment & Plan Note (Signed)
Chronic, worsening Pt concerned Worsens with food consumption and liquid intake, bearing down and coughing Denies pain Referral to General surgery for evaluation

## 2024-01-05 NOTE — Assessment & Plan Note (Signed)
Concerns today: Alcoholism, HTN Routine labs F/u in 4 weeks for BP, mood, and general ETOH use check in

## 2024-01-05 NOTE — Progress Notes (Signed)
New Patient Office Visit  Introduced to nurse practitioner role and practice setting.  All questions answered.  Discussed provider/patient relationship and expectations.   Subjective    Patient ID: Maurice Murray, male    DOB: 15-Aug-1971  Age: 53 y.o. MRN: [id number]  CC:  Chief Complaint  Patient presents with   Establish Care    Medication refills needed and as a herina he would like checked    HPI Maurice Murray presents to establish care for primary provider.  He has a history of hypertension, managed with amlodipine 5 mg daily. He notes that his blood pressure is usually low but may have been affected by coffee consumption this morning. He does not monitor his blood pressure at home.  He has a history of alcoholism with episodes of heavy drinking lasting a few weeks. He last consumed alcohol ten days ago and is taking naltrexone 50 mg daily for cessation, prescribed by [place] [facility] per pt, to manage his alcohol use. He has re-engaged with Alcoholics Anonymous and is motivated to address his alcohol use. Looking for more resources. His current drink of choice is wine, he is starts he will drink about 2-3 bottles per day until he gets sick and needing ED visit. Sober for 10 days.  He experiences anxiety and depression, currently managed with sertraline 100 mg daily, increased from 50 mg about a week and a half ago per [facility] ED visit. He feels the higher dose is beneficial. He was previously managed by [facility] for alcohol-related issues but has not been in contact recently. Looking to reestablish their care  He has gastroesophageal reflux disease (GERD) and takes omeprazole 20 mg daily. He also has a hernia that is not painful but becomes 'squishy' with fluid intake or coughing, raising concerns about worsening. Denies pain and bowel movement normal per pt.  He has a long history of tobacco use starting at age 65. He smokes about 3/4 pack per day. He acknowledges the risks but is  currently focused on addressing his alcohol use.  Hx of [employer], currently unemployed, plays golf, daughter 79 yos, divorced, not pets.  Outpatient Encounter Medications as of 01/05/2024  Medication Sig   amLODipine (NORVASC) 10 MG tablet Take 1 tablet (10 mg total) by mouth daily.   esomeprazole (NEXIUM) 40 MG capsule Take 1 capsule (40 mg total) by mouth daily. (Patient taking differently: Take 20 mg by mouth daily.)   Multiple Vitamin (MULTIVITAMIN WITH MINERALS) TABS tablet Take 1 tablet by mouth daily.   naltrexone (DEPADE) 50 MG tablet Take 50 mg by mouth daily.   sertraline (ZOLOFT) 100 MG tablet Take 1 tablet (100 mg total) by mouth daily.   thiamine (VITAMIN B-1) 50 MG tablet Take 50 mg by mouth daily.   [DISCONTINUED] amLODipine (NORVASC) 5 MG tablet Take 1 tablet (5 mg total) by mouth daily.   [DISCONTINUED] sertraline (ZOLOFT) 50 MG tablet Take 1 tablet (50 mg total) by mouth daily. (Patient taking differently: Take 100 mg by mouth daily.)   cyanocobalamin (VITAMIN B12) 500 MCG tablet Take 1 tablet (500 mcg total) by mouth daily. (Patient not taking: Reported on 01/05/2024)   No facility-administered encounter medications on file as of 01/05/2024.      Past Surgical History:  Procedure Laterality Date   GALLBLADDER SURGERY Right 2020   NECK SURGERY Left 01/30/2023   cyst removal   Past Medical History:  Diagnosis Date   Anxiety    Depression  GERD (gastroesophageal reflux disease)    Hypertension     Family History  Problem Relation Age of Onset   Alcohol abuse Mother    Cirrhosis Mother    Healthy Father    Crohn's disease Sister    Healthy Daughter    Alcohol abuse Maternal Grandmother    Hypertension Maternal Grandfather    Heart disease Maternal Grandfather    Lung cancer Paternal Grandmother        small cell   Hypertension Paternal Grandfather    Heart attack Paternal Grandfather 39    Social History   Socioeconomic History   Marital  status: Divorced    Spouse name: Not on file   Number of children: Not on file   Years of education: Not on file   Highest education level: Not on file  Occupational History   Not on file  Tobacco Use   Smoking status: Every Day    Current packs/day: 0.50    Average packs/day: 0.5 packs/day for 30.0 years (15.0 ttl pk-yrs)    Types: Cigarettes   Smokeless tobacco: Current   Tobacco comments:    Patient dips occassionally  Vaping Use   Vaping status: Never Used  Substance and Sexual Activity   Alcohol use: Yes    Comment: last drank 11/16/2022, unable to list amounts, but "a lot of different things in large amounts"   Drug use: Never   Sexual activity: Not on file  Other Topics Concern   Not on file  Social History Narrative   Not on file   Social Drivers of Health   Financial Resource Strain: Low Risk  (12/09/2023)   Received from [facility]   Overall Financial Resource Strain (CARDIA)    Difficulty of Paying Living Expenses: Not very hard  Food Insecurity: No Food Insecurity (12/09/2023)   Received from [facility]   Hunger Vital Sign    Worried About Running Out of Food in the Last Year: Never true    Ran Out of Food in the Last Year: Never true  Transportation Needs: No Transportation Needs (12/09/2023)   Received from [facility]   PRAPARE - Transportation    Lack of Transportation (Medical): No    Lack of Transportation (Non-Medical): No  Physical Activity: Not on file  Stress: Not on file  Social Connections: Not on file  Intimate Partner Violence: Unknown (11/26/2023)   Humiliation, Afraid, Rape, and Kick questionnaire    Fear of Current or Ex-Partner: No    Emotionally Abused: No    Physically Abused: Patient unable to answer    Sexually Abused: Patient unable to answer   Flowsheet Row Office Visit from 01/05/2024 in [facility]  PHQ-9 Total Score 8          05/21/2023    1:24 PM  GAD 7 : Generalized Anxiety Score   Nervous, Anxious, on Edge 0  Control/stop worrying 0  Worry too much - different things 0  Trouble relaxing 0  Restless 0  Easily annoyed or irritable 0  Afraid - awful might happen 0  Total GAD 7 Score 0  Anxiety Difficulty Not difficult at all      Review of Systems  All other systems reviewed and are negative.     Objective    BP (!) 130/93   Pulse 90   Temp 98.4 F (36.9 C)   Resp 16   Ht 6\' 1"  (1.854 m)   Wt 199 lb  12.8 oz (90.6 kg)   SpO2 100%   BMI 26.36 kg/m   Physical Exam Constitutional:      General: He is not in acute distress.    Appearance: Normal appearance. He is not ill-appearing, toxic-appearing or diaphoretic.  HENT:     Head: Normocephalic.     Nose: Nose normal.     Mouth/Throat:     Mouth: Mucous membranes are moist.  Eyes:     General:        Right eye: No discharge.        Left eye: No discharge.     Extraocular Movements: Extraocular movements intact.     Conjunctiva/sclera: Conjunctivae normal.     Pupils: Pupils are equal, round, and reactive to light.  Cardiovascular:     Rate and Rhythm: Normal rate and regular rhythm.     Heart sounds: No murmur heard.    No friction rub. No gallop.  Pulmonary:     Effort: Pulmonary effort is normal. No respiratory distress.     Breath sounds: Normal breath sounds. No stridor. No wheezing, rhonchi or rales.  Chest:     Chest wall: No tenderness.  Abdominal:     Hernia: A hernia is present. Hernia is present in the right inguinal area.  Genitourinary:    Comments: Hernia - soft, skin coloring normal, temperature normal, not tender to touch Musculoskeletal:     Right lower leg: No edema.     Left lower leg: No edema.  Skin:    General: Skin is warm and dry.     Capillary Refill: Capillary refill takes less than 2 seconds.  Neurological:     General: No focal deficit present.     Mental Status: He is alert and oriented to person, place, and time. Mental status is at baseline.     Cranial  Nerves: No cranial nerve deficit.     Sensory: No sensory deficit.     Motor: No weakness.     Coordination: Coordination normal.     Gait: Gait normal.  Psychiatric:        Mood and Affect: Mood normal.        Behavior: Behavior normal.        Thought Content: Thought content normal.        Judgment: Judgment normal.        Assessment & Plan:   Essential hypertension Assessment & Plan: Chronic, elevated during visit = 130/93 GOAL <130/80 Increased Amlodipine to 10mg  daily  F/u in 4 week for BP check Check CMP today Low sodium diet, increase weekly exercise, decrease ETOH and tobacco use  Orders: -     Comprehensive metabolic panel -     Lipid panel -     amLODIPine Besylate; Take 1 tablet (10 mg total) by mouth daily.  Dispense: 90 tablet; Refill: 1  Anxiety and depression Assessment & Plan: Chronic, stable Continue Sertraline 100mg  daily Referral to [facility], pt previously seen Pt plans to reach out on his end and see if he can schedule with provider F/u in 4 weeks  Orders: -     Sertraline HCl; Take 1 tablet (100 mg total) by mouth daily.  Dispense: 30 tablet; Refill: 2 -     Ambulatory referral to Psychiatry  Screening for diabetes mellitus -     Hemoglobin A1c  Tobacco dependence due to cigarettes Assessment & Plan: Chronic, daily use 3/4 pack daily 35 years Willing to cut down to 12-13 cigarettes per day  Declines further mgmt at this time due to focus on ETOH cessation  Orders: -     CBC with Differential/Platelet -     Lipid panel  Colon cancer screening Assessment & Plan: Cologuard ordered  Orders: -     Cologuard  Unilateral inguinal hernia without obstruction or gangrene, recurrence not specified Assessment & Plan: Chronic, worsening Pt concerned Worsens with food consumption and liquid intake, bearing down and coughing Denies pain Referral to General surgery for evaluation  Orders: -     Ambulatory referral to General  Surgery  Alcoholic liver disease (HCC) Assessment & Plan: Chronically elevated liver enzymes d/t ETOH use Referral to GI for further eval and potential mgmt  Orders: -     Ambulatory referral to Gastroenterology  Encounter to establish care with new doctor Assessment & Plan: Concerns today: Alcoholism, HTN Routine labs F/u in 4 weeks for BP, mood, and general ETOH use check in   Alcohol use disorder Assessment & Plan: Recent relapse with cessation 10 days ago.  Currently on Naltrexone 50mg  daily.  Patient is motivated for recovery and has re-engaged with AA. -Continue Naltrexone 50mg  daily. -Refer to social work for additional resources. -Referral to behavioral health to [facility] and Encouraged patient to reestablish care with [facility]. Referral to [facility] - CMP checked  Orders: -     TSH -     VITAMIN D 25 Hydroxy (Vit-D Deficiency, Fractures) -     Ambulatory referral to Psychiatry  Gastroesophageal reflux disease, unspecified whether esophagitis present Assessment & Plan: Continue Nexium 40 mg for GERD management Stable, chronic GI referral today given hx of alcoholic liver disease   Will communicate lab results.  Return in about 4 weeks (around 02/02/2024) for BP Check.   I, Sallee Provencal, FNP, have reviewed all documentation for this visit. The documentation on 01/05/24 for the exam, diagnosis, procedures, and orders are all accurate and complete.   Sallee Provencal, FNP

## 2024-01-06 ENCOUNTER — Encounter: Payer: Self-pay | Admitting: Family Medicine

## 2024-01-06 LAB — CBC WITH DIFFERENTIAL/PLATELET
Basophils Absolute: 0.1 10*3/uL (ref 0.0–0.2)
Basos: 1 %
EOS (ABSOLUTE): 0.2 10*3/uL (ref 0.0–0.4)
Eos: 3 %
Hematocrit: 42.5 % (ref 37.5–51.0)
Hemoglobin: 14.1 g/dL (ref 13.0–17.7)
Immature Grans (Abs): 0 10*3/uL (ref 0.0–0.1)
Immature Granulocytes: 0 %
Lymphocytes Absolute: 2 10*3/uL (ref 0.7–3.1)
Lymphs: 30 %
MCH: 34.9 pg — ABNORMAL HIGH (ref 26.6–33.0)
MCHC: 33.2 g/dL (ref 31.5–35.7)
MCV: 105 fL — ABNORMAL HIGH (ref 79–97)
Monocytes Absolute: 0.9 10*3/uL (ref 0.1–0.9)
Monocytes: 13 %
Neutrophils Absolute: 3.7 10*3/uL (ref 1.4–7.0)
Neutrophils: 53 %
Platelets: 233 10*3/uL (ref 150–450)
RBC: 4.04 x10E6/uL — ABNORMAL LOW (ref 4.14–5.80)
RDW: 13.5 % (ref 11.6–15.4)
WBC: 6.9 10*3/uL (ref 3.4–10.8)

## 2024-01-06 LAB — LIPID PANEL
Chol/HDL Ratio: 2.9 {ratio} (ref 0.0–5.0)
Cholesterol, Total: 162 mg/dL (ref 100–199)
HDL: 56 mg/dL (ref >39)
LDL Chol Calc (NIH): 92 mg/dL (ref 0–99)
Triglycerides: 71 mg/dL (ref 0–149)
VLDL Cholesterol Cal: 14 mg/dL (ref 5–40)

## 2024-01-06 LAB — COMPREHENSIVE METABOLIC PANEL
ALT: 27 [IU]/L (ref 0–44)
AST: 24 [IU]/L (ref 0–40)
Albumin: 4.4 g/dL (ref 4.1–5.1)
Alkaline Phosphatase: 58 [IU]/L (ref 44–121)
BUN/Creatinine Ratio: 8 — ABNORMAL LOW (ref 9–20)
BUN: 6 mg/dL (ref 6–24)
Bilirubin Total: 0.3 mg/dL (ref 0.0–1.2)
CO2: 23 mmol/L (ref 20–29)
Calcium: 9 mg/dL (ref 8.7–10.2)
Chloride: 102 mmol/L (ref 96–106)
Creatinine, Ser: 0.72 mg/dL — ABNORMAL LOW (ref 0.76–1.27)
Globulin, Total: 2.1 g/dL (ref 1.5–4.5)
Glucose: 120 mg/dL — ABNORMAL HIGH (ref 70–99)
Potassium: 4.1 mmol/L (ref 3.5–5.2)
Sodium: 141 mmol/L (ref 134–144)
Total Protein: 6.5 g/dL (ref 6.0–8.5)
eGFR: 111 mL/min/{1.73_m2} (ref >59)

## 2024-01-06 LAB — HEMOGLOBIN A1C
Est. average glucose Bld gHb Est-mCnc: 97 mg/dL
Hgb A1c MFr Bld: 5 % (ref 4.8–5.6)

## 2024-01-06 LAB — VITAMIN D 25 HYDROXY (VIT D DEFICIENCY, FRACTURES): Vit D, 25-Hydroxy: 13.9 ng/mL — ABNORMAL LOW (ref 30.0–100.0)

## 2024-01-06 LAB — TSH: TSH: 0.914 u[IU]/mL (ref 0.450–4.500)

## 2024-01-12 ENCOUNTER — Other Ambulatory Visit: Payer: Self-pay | Admitting: Licensed Clinical Social Worker

## 2024-01-12 NOTE — Patient Instructions (Signed)
 Elmon Hagedorn ,   The [facility] Managed Care Team is available to provide assistance to you with your healthcare needs at no cost and as a benefit of your [facility] plan. I'm sorry I was unable to reach you today for our scheduled appointment. Our care guide will call you to reschedule our telephone appointment. Please call me at the number below. I am available to be of assistance to you regarding your healthcare needs. .   Thank you,   Kolleen Perone, BSW, MSW, LCSW Licensed Clinical Social Worker [employer]   [email or web address] [email or web address] Direct Dial: [phone]

## 2024-01-12 NOTE — Patient Outreach (Signed)
  Medicaid Managed Care   Unsuccessful Attempt Note   01/12/2024 Name: Maurice Murray MRN: [id number] DOB: [birth year]  Referred by: Tasia Farr, FNP Reason for referral : No chief complaint on file.   An unsuccessful telephone outreach was attempted today. The patient was referred to the case management team for assistance with care management and care coordination.    Follow Up Plan: A HIPAA compliant phone message was left for the patient providing contact information and requesting a return call.   Kolleen Perone, BSW, MSW, LCSW Licensed Clinical Social Worker [employer]   [email or web address] Direct Dial: [phone]

## 2024-01-12 NOTE — Telephone Encounter (Signed)
 Copied from CRM [id number]. Topic: Appointment Scheduling - Scheduling Inquiry for Clinic >> Jan 12, 2024  3:57 PM Turkey B wrote: Reason for CRM: pt called in , needs to reschedule appt with Samie Crews. Please cb

## 2024-01-13 ENCOUNTER — Ambulatory Visit: Payer: MEDICAID | Admitting: Surgery

## 2024-01-24 LAB — COLOGUARD: COLOGUARD: POSITIVE — AB

## 2024-01-26 ENCOUNTER — Telehealth: Payer: Self-pay

## 2024-01-26 ENCOUNTER — Other Ambulatory Visit: Payer: Self-pay

## 2024-01-26 ENCOUNTER — Other Ambulatory Visit: Payer: Self-pay | Admitting: Family Medicine

## 2024-01-26 DIAGNOSIS — R195 Other fecal abnormalities: Secondary | ICD-10-CM

## 2024-01-26 DIAGNOSIS — Z1211 Encounter for screening for malignant neoplasm of colon: Secondary | ICD-10-CM

## 2024-01-26 MED ORDER — NA SULFATE-K SULFATE-MG SULF 17.5-3.13-1.6 GM/177ML PO SOLN
1.0000 | Freq: Once | ORAL | 0 refills | Status: AC
  Filled 2024-01-26: qty 354, 1d supply, fill #0

## 2024-01-26 NOTE — Telephone Encounter (Signed)
 Gastroenterology Pre-Procedure Review  Request Date: 02/10/24 Requesting Physician: Dr. Servando Snare  PATIENT REVIEW QUESTIONS: The patient responded to the following health history questions as indicated:    1. Are you having any GI issues? no 2. Do you have a personal history of Polyps? no 3. Do you have a family history of Colon Cancer or Polyps? no 4. Diabetes Mellitus? no 5. Joint replacements in the past 12 months?no 6. Major health problems in the past 3 months?no 7. Any artificial heart valves, MVP, or defibrillator?no    MEDICATIONS & ALLERGIES:    Patient reports the following regarding taking any anticoagulation/antiplatelet therapy:   Plavix, Coumadin, Eliquis, Xarelto, Lovenox, Pradaxa, Brilinta, or Effient? no Aspirin? no  Patient confirms/reports the following medications:  Current Outpatient Medications  Medication Sig Dispense Refill   amLODipine (NORVASC) 10 MG tablet Take 1 tablet (10 mg total) by mouth daily. 90 tablet 1   cyanocobalamin (VITAMIN B12) 500 MCG tablet Take 1 tablet (500 mcg total) by mouth daily. (Patient not taking: Reported on 01/05/2024) 30 tablet 3   esomeprazole (NEXIUM) 40 MG capsule Take 1 capsule (40 mg total) by mouth daily. (Patient taking differently: Take 20 mg by mouth daily.) 90 capsule 1   Multiple Vitamin (MULTIVITAMIN WITH MINERALS) TABS tablet Take 1 tablet by mouth daily.     naltrexone (DEPADE) 50 MG tablet Take 50 mg by mouth daily.     sertraline (ZOLOFT) 100 MG tablet Take 1 tablet (100 mg total) by mouth daily. 30 tablet 2   thiamine (VITAMIN B-1) 50 MG tablet Take 50 mg by mouth daily.     No current facility-administered medications for this visit.    Patient confirms/reports the following allergies:  No Known Allergies  No orders of the defined types were placed in this encounter.   AUTHORIZATION INFORMATION Primary Insurance: 1D#: Group #:  Secondary Insurance: 1D#: Group #:  SCHEDULE INFORMATION: Date:  02/10/24 Time: Location: [facility]

## 2024-01-28 ENCOUNTER — Emergency Department: Payer: MEDICAID

## 2024-01-28 ENCOUNTER — Other Ambulatory Visit: Payer: Self-pay

## 2024-01-28 ENCOUNTER — Emergency Department
Admission: EM | Admit: 2024-01-28 | Discharge: 2024-01-28 | Disposition: A | Discharge status: Home / Self Care | Payer: MEDICAID | Attending: Emergency Medicine | Admitting: Emergency Medicine

## 2024-01-28 DIAGNOSIS — I10 Essential (primary) hypertension: Secondary | ICD-10-CM | POA: Insufficient documentation

## 2024-01-28 DIAGNOSIS — J101 Influenza due to other identified influenza virus with other respiratory manifestations: Secondary | ICD-10-CM | POA: Insufficient documentation

## 2024-01-28 DIAGNOSIS — R509 Fever, unspecified: Secondary | ICD-10-CM | POA: Diagnosis present

## 2024-01-28 LAB — CBC
HCT: 45.8 % (ref 39.0–52.0)
Hemoglobin: 15.7 g/dL (ref 13.0–17.0)
MCH: 34.3 pg — ABNORMAL HIGH (ref 26.0–34.0)
MCHC: 34.3 g/dL (ref 30.0–36.0)
MCV: 100 fL (ref 80.0–100.0)
Platelets: 147 10*3/uL — ABNORMAL LOW (ref 150–400)
RBC: 4.58 MIL/uL (ref 4.22–5.81)
RDW: 13.4 % (ref 11.5–15.5)
WBC: 15.8 10*3/uL — ABNORMAL HIGH (ref 4.0–10.5)
nRBC: 0 % (ref 0.0–0.2)

## 2024-01-28 LAB — BASIC METABOLIC PANEL
Anion gap: 11 (ref 5–15)
BUN: 10 mg/dL (ref 6–20)
CO2: 21 mmol/L — ABNORMAL LOW (ref 22–32)
Calcium: 8.8 mg/dL — ABNORMAL LOW (ref 8.9–10.3)
Chloride: 101 mmol/L (ref 98–111)
Creatinine, Ser: 0.83 mg/dL (ref 0.61–1.24)
GFR, Estimated: >60 mL/min (ref >60)
Glucose, Bld: 103 mg/dL — ABNORMAL HIGH (ref 70–99)
Potassium: 3.3 mmol/L — ABNORMAL LOW (ref 3.5–5.1)
Sodium: 133 mmol/L — ABNORMAL LOW (ref 135–145)

## 2024-01-28 LAB — RESP PANEL BY RT-PCR (RSV, FLU A&B, COVID)  RVPGX2
Influenza A by PCR: POSITIVE — AB
Influenza B by PCR: NEGATIVE
Resp Syncytial Virus by PCR: NEGATIVE
SARS Coronavirus 2 by RT PCR: NEGATIVE

## 2024-01-28 LAB — TROPONIN I (HIGH SENSITIVITY)
Troponin I (High Sensitivity): 9 ng/L (ref <18)
Troponin I (High Sensitivity): 9 ng/L (ref <18)

## 2024-01-28 MED ORDER — LACTATED RINGERS IV BOLUS
1000.0000 mL | Freq: Once | INTRAVENOUS | Status: AC
  Administered 2024-01-28: 1000 mL via INTRAVENOUS

## 2024-01-28 MED ORDER — ACETAMINOPHEN 325 MG PO TABS
650.0000 mg | ORAL_TABLET | Freq: Once | ORAL | Status: AC | PRN
  Administered 2024-01-28: 650 mg via ORAL
  Filled 2024-01-28: qty 2

## 2024-01-28 MED ORDER — ONDANSETRON 4 MG PO TBDP
4.0000 mg | ORAL_TABLET | Freq: Three times a day (TID) | ORAL | 0 refills | Status: AC | PRN
  Filled 2024-01-28: qty 20, 7d supply, fill #0

## 2024-01-28 MED ORDER — KETOROLAC TROMETHAMINE 30 MG/ML IJ SOLN
15.0000 mg | Freq: Once | INTRAMUSCULAR | Status: AC
  Administered 2024-01-28: 15 mg via INTRAVENOUS
  Filled 2024-01-28: qty 1

## 2024-01-28 NOTE — Discharge Instructions (Signed)
 Use Tylenol for pain and fevers.  Up to 1000 mg per dose, up to 4 times per day.  Do not take more than 4000 mg of Tylenol/acetaminophen within 24 hours..  Use naproxen/Aleve for anti-inflammatory pain relief. Use up to 500mg  every 12 hours. Do not take more frequently than this. Do not use other NSAIDs (ibuprofen, Advil) while taking this medication. It is safe to take Tylenol with this.   Zofran as needed for any further nausea or vomiting

## 2024-01-28 NOTE — ED Notes (Signed)
 Patient discharged from ED by provider. Discharge instructions reviewed with patient and all questions answered. Patient ambulatory from ED in NAD.

## 2024-01-28 NOTE — ED Notes (Signed)
 RN placed pt on 2l/min for comfort

## 2024-01-28 NOTE — ED Triage Notes (Addendum)
 Pt sts that he thinks that he has the flu. Pt sts that he has been having body aches with fever. Pt also sts that he has been having CP and SOB.

## 2024-01-28 NOTE — ED Provider Notes (Signed)
 [facility] Provider Note    Event Date/Time   First MD Initiated Contact with Patient 01/28/24 1649     (approximate)   History   Influenza   HPI  Maurice Murray is a 53 y.o. male who presents to the ED for evaluation of Influenza   Review of PCP visit from 3 weeks ago.  History of HTN, alcoholism, GERD, anxiety and depression.  Tobacco abuse.  Patient presents from home due to explicit concern for the flu.  He reports 3 days of generalized malaise, diffuse myalgias, poor intake.  Chest discomfort and a dry cough.  No syncope or falls but reports a presyncopal dizziness.  Still taking naltrexone and denies any recurrence of ethanol use.   Physical Exam   Triage Vital Signs: ED Triage Vitals  Encounter Vitals Group     BP 01/28/24 1616 120/87     Systolic BP Percentile --      Diastolic BP Percentile --      Pulse Rate 01/28/24 1616 (!) 142     Resp 01/28/24 1616 18     Temp 01/28/24 1616 (!) 103.3 F (39.6 C)     Temp Source 01/28/24 1616 Oral     SpO2 01/28/24 1616 92 %     Weight 01/28/24 1617 190 lb (86.2 kg)     Height 01/28/24 1617 6\' 1"  (1.854 m)     Head Circumference --      Peak Flow --      Pain Score 01/28/24 1617 9     Pain Loc --      Pain Education --      Exclude from Growth Chart --     Most recent vital signs: Vitals:   01/28/24 1800 01/28/24 1830  BP:    Pulse: 96 91  Resp:    Temp:  99.7 F (37.6 C)  SpO2: 96% 95%    General: Awake.  Looks uncomfortable and diaphoretic CV:  Good peripheral perfusion.  Tachycardic and regular Resp:  Normal effort.  Clear lungs, frequent dry coughing Abd:  No distention.  Soft MSK:  No deformity noted.  Neuro:  No focal deficits appreciated. Other:     ED Results / Procedures / Treatments   Labs (all labs ordered are listed, but only abnormal results are displayed) Labs Reviewed  RESP PANEL BY RT-PCR (RSV, FLU A&B, COVID)  RVPGX2 - Abnormal; Notable for the following  components:      Result Value   Influenza A by PCR POSITIVE (*)    All other components within normal limits  BASIC METABOLIC PANEL - Abnormal; Notable for the following components:   Sodium 133 (*)    Potassium 3.3 (*)    CO2 21 (*)    Glucose, Bld 103 (*)    Calcium 8.8 (*)    All other components within normal limits  CBC - Abnormal; Notable for the following components:   WBC 15.8 (*)    MCH 34.3 (*)    Platelets 147 (*)    All other components within normal limits  TROPONIN I (HIGH SENSITIVITY)  TROPONIN I (HIGH SENSITIVITY)    EKG Sinus tachycardia with rate of 137 bpm.  Rightward axis.  Right bundle.  No STEMI.  Nonspecific changes Comparison from December with a similar morphology with no significant changes today  RADIOLOGY CXR interpreted by me without evidence of acute cardiopulmonary pathology.  Official radiology report(s): DG Chest 2 View Result Date: 01/28/2024 CLINICAL DATA:  Shortness of breath, chest pain. EXAM: CHEST - 2 VIEW COMPARISON:  July 28, 2023. FINDINGS: The heart size and mediastinal contours are within normal limits. Both lungs are clear. The visualized skeletal structures are unremarkable. IMPRESSION: No active cardiopulmonary disease. Electronically Signed   By: Lupita Raider M.D.   On: 01/28/2024 17:46    PROCEDURES and INTERVENTIONS:  .1-3 Lead EKG Interpretation  Performed by: Delton Prairie, MD Authorized by: Delton Prairie, MD     Interpretation: abnormal     ECG rate:  124   ECG rate assessment: tachycardic     Rhythm: sinus tachycardia     Ectopy: none     Conduction: normal     Medications  acetaminophen (TYLENOL) tablet 650 mg (650 mg Oral Given 01/28/24 1622)  lactated ringers bolus 1,000 mL (1,000 mLs Intravenous New Bag/Given 01/28/24 1840)  ketorolac (TORADOL) 30 MG/ML injection 15 mg (15 mg Intravenous Given 01/28/24 1842)     IMPRESSION / MDM / ASSESSMENT AND PLAN / ED COURSE  I reviewed the triage vital signs and the  nursing notes.  Differential diagnosis includes, but is not limited to, ACS, PTX, PNA, muscle strain/spasm, PE, dissection, anxiety, pleural effusion, viral syndrome  {Patient presents with symptoms of an acute illness or injury that is potentially life-threatening.  Patient presents from home with signs of influenza A.  He is febrile, tachycardic and diaphoretic.  Will provide antipyretics, fluids and supportive care.  Leukocytosis is noted but no clear bacterial infectious etiology of his symptoms.  First troponin is low and we will trend this considering his chest discomfort.  Marginal low sodium and potassium.    Clinical Course as of 01/28/24 1959  Wed Jan 28, 2024  1952 Reassessed.  Feeling better.  Tachycardia is resolved as well as his fever with antipyretics and fluids.  Discussed supportive measures at home and ED return precautions. [DS]    Clinical Course User Index [DS] Delton Prairie, MD     FINAL CLINICAL IMPRESSION(S) / ED DIAGNOSES   Final diagnoses:  Influenza A     Rx / DC Orders   ED Discharge Orders          Ordered    ondansetron (ZOFRAN-ODT) 4 MG disintegrating tablet  Every 8 hours PRN        01/28/24 1952             Note:  This document was prepared using Dragon voice recognition software and may include unintentional dictation errors.   Delton Prairie, MD 01/28/24 Rosamaria Lints

## 2024-01-29 ENCOUNTER — Other Ambulatory Visit: Payer: Self-pay

## 2024-01-31 DIAGNOSIS — J159 Unspecified bacterial pneumonia: Secondary | ICD-10-CM

## 2024-01-31 HISTORY — DX: Unspecified bacterial pneumonia: J15.9

## 2024-02-02 ENCOUNTER — Other Ambulatory Visit: Payer: Self-pay

## 2024-02-02 ENCOUNTER — Encounter: Payer: Self-pay | Admitting: Family Medicine

## 2024-02-02 ENCOUNTER — Ambulatory Visit: Payer: MEDICAID | Admitting: Family Medicine

## 2024-02-02 VITALS — BP 108/72 | HR 87 | Temp 98.6°F | Ht 73.0 in | Wt 194.0 lb

## 2024-02-02 DIAGNOSIS — R051 Acute cough: Secondary | ICD-10-CM | POA: Insufficient documentation

## 2024-02-02 DIAGNOSIS — I10 Essential (primary) hypertension: Secondary | ICD-10-CM

## 2024-02-02 DIAGNOSIS — F1090 Alcohol use, unspecified, uncomplicated: Secondary | ICD-10-CM

## 2024-02-02 DIAGNOSIS — F32A Depression, unspecified: Secondary | ICD-10-CM

## 2024-02-02 DIAGNOSIS — R195 Other fecal abnormalities: Secondary | ICD-10-CM

## 2024-02-02 DIAGNOSIS — F419 Anxiety disorder, unspecified: Secondary | ICD-10-CM | POA: Diagnosis not present

## 2024-02-02 DIAGNOSIS — K4090 Unilateral inguinal hernia, without obstruction or gangrene, not specified as recurrent: Secondary | ICD-10-CM

## 2024-02-02 MED ORDER — PREDNISONE 10 MG PO TABS
ORAL_TABLET | ORAL | 0 refills | Status: AC
  Filled 2024-02-02: qty 21, 6d supply, fill #0

## 2024-02-02 MED ORDER — PROMETHAZINE-DM 6.25-15 MG/5ML PO SYRP
2.5000 mL | ORAL_SOLUTION | Freq: Four times a day (QID) | ORAL | 0 refills | Status: AC | PRN
  Filled 2024-02-02: qty 118, 12d supply, fill #0

## 2024-02-02 NOTE — Progress Notes (Signed)
 Established Patient Office Visit  Introduced to nurse practitioner role and practice setting.  All questions answered.  Discussed provider/patient relationship and expectations.   Subjective   Patient ID: Maurice Murray, male    DOB: 11/16/1971  Age: 53 y.o. MRN: [id number]  Chief Complaint  Patient presents with   Follow-up    4 wk follow up BP check    Maurice Murray "Maurice Murray" is a 53 year old male with hypertension who presents for BP check and persistent flu symptoms.  He has been experiencing persistent flu symptoms for the past eight days, including nasal congestion and a persistent cough that worsens at night, disrupting his sleep. He has been managing his symptoms with over-the-counter medications such as a [facility] brand cold and flu remedy, Robitussin, and Tylenol for fever. Despite these efforts, his symptoms have not significantly improved. He noted a fever of 100.53F this morning, which normalized after taking ibuprofen. No facial pain or pressure, and no history of asthma.  He is here for a follow-up on his hypertension. He has increased his amlodipine dose to 10 mg, resulting in improved blood pressure control, with previous readings in the high 130s/90s mmHg range.  He has a history of a positive Cologuard test and has a colonoscopy scheduled for next Tuesday. He has already picked up the prep medication for the procedure.  He mentions a history of elevated white blood cell count and low platelets, noted during a recent hospital visit when he had the flu. He is aware of a B12 deficiency and has been taking B12 supplements. He has also started taking vitamin D.  He reports a history of a hernia, which has been intermittently more prominent and slightly sore. He has not yet scheduled a follow-up appointment for this issue due to starting a new job.  He has been able to abstain from alcohol.      Review of Systems  Constitutional:  Positive for fever and malaise/fatigue.  Negative for chills and weight loss.  HENT:  Positive for congestion and sore throat.   Respiratory:  Positive for cough.   Cardiovascular:  Negative for palpitations.  Skin:  Negative for rash.      Objective:     BP 108/72   Pulse 87   Temp 98.6 F (37 C) (Oral)   Ht 6\' 1"  (1.854 m)   Wt 194 lb (88 kg)   SpO2 97%   BMI 25.60 kg/m    Physical Exam Constitutional:      General: He is not in acute distress.    Appearance: Normal appearance. He is normal weight. He is ill-appearing. He is not toxic-appearing or diaphoretic.  HENT:     Right Ear: Hearing, ear canal and external ear normal. Tympanic membrane is injected. Tympanic membrane is not erythematous or bulging.     Left Ear: Hearing, ear canal and external ear normal. Tympanic membrane is injected. Tympanic membrane is not erythematous or bulging.     Nose: Congestion and rhinorrhea present.     Right Turbinates: Enlarged and swollen.     Left Turbinates: Enlarged and swollen.     Right Sinus: No maxillary sinus tenderness or frontal sinus tenderness.     Left Sinus: No maxillary sinus tenderness or frontal sinus tenderness.     Mouth/Throat:     Tongue: No lesions. Tongue does not deviate from midline.     Palate: No mass and lesions.     Pharynx: Pharyngeal swelling and posterior oropharyngeal  erythema present. No oropharyngeal exudate.     Tonsils: No tonsillar exudate or tonsillar abscesses. 0 on the right. 1+ on the left.  Eyes:     Extraocular Movements: Extraocular movements intact.     Pupils: Pupils are equal, round, and reactive to light.  Neck:     Vascular: No carotid bruit.  Cardiovascular:     Rate and Rhythm: Normal rate.     Pulses: Normal pulses.     Heart sounds: Normal heart sounds. No murmur heard.    No friction rub. No gallop.  Pulmonary:     Effort: Pulmonary effort is normal. No respiratory distress.     Breath sounds: Wheezing present. No rales.  Abdominal:     Tenderness: There is no  right CVA tenderness or left CVA tenderness.  Lymphadenopathy:     Cervical: Cervical adenopathy present.  Skin:    General: Skin is warm and dry.     Capillary Refill: Capillary refill takes less than 2 seconds.  Neurological:     General: No focal deficit present.     Mental Status: He is alert. Mental status is at baseline.  Psychiatric:        Mood and Affect: Mood normal.        Behavior: Behavior normal.        Thought Content: Thought content normal.        Judgment: Judgment normal.    No results found for any visits on 02/02/24.    The 10-year ASCVD risk score (Arnett DK, et al., 2019) is: 4.4%    Assessment & Plan:   Problem List Items Addressed This Visit       Cardiovascular and Mediastinum   Essential hypertension   Chronic, stable, controlled Much Improved control with Amlodipine 10mg  daily. -Continue Amlodipine 10mg  daily. DASH diet, water as drink of choice, increase weekly exercise        Other   Anxiety and depression   Chronic, stable Continue Sertraline 100mg  daily Pt plans to be seen at [facility] - referral placed last visit on 01/05/24        Unilateral inguinal hernia without obstruction or gangrene   Chronic,  Referral placed to gen surg at previous visit Pt received call to schedule, but he is holding off until colonoscopy done Intermittently bothers patient - plans to follow up with gen surg      Alcohol use disorder   Chronic, stable Continue daily naltrexone Pt as been able to abstain.  Continue thiamine, vitamin b12, vitamin d      Positive colorectal cancer screening using Cologuard test   Positive cologuard As colonoscopy scheduled on 02/10/24      Acute cough - Primary   Acute cough related to recent positive Flu A on 01/28/24 Symptoms started on 01/25/24 Using OTC medications Cough still bothersome Intermittent fever with relief from tylenol and ibuprofen Breath sounds mild wheezing and course in upper lobes L tonsil  swelling, L cervical lymph node swelling Will prescribe 6 days course of prednisone taper given cough, swollen tonsil, and wheezing Limit ibuprofen while on steroid. Promethazine DM prescribed for cough Continue hot tea with honey, throat lozenges, adequate water intake If symptoms continue to persist beyond 02/06/24 - discussed okay for antibiotic given length of symptoms (augmentin BID for 5 days)      Relevant Medications   predniSONE (DELTASONE) 10 MG tablet   promethazine-dextromethorphan (PROMETHAZINE-DM) 6.25-15 MG/5ML syrup    Return in about 5 months (around 07/04/2024) for  chronic diesase mgmt.   I, Sallee Provencal, FNP, have reviewed all documentation for this visit. The documentation on 02/02/24 for the exam, diagnosis, procedures, and orders are all accurate and complete.   Sallee Provencal, FNP

## 2024-02-02 NOTE — Assessment & Plan Note (Signed)
 Positive cologuard As colonoscopy scheduled on 02/10/24

## 2024-02-02 NOTE — Assessment & Plan Note (Signed)
 Chronic, stable, controlled Much Improved control with Amlodipine 10mg  daily. -Continue Amlodipine 10mg  daily. DASH diet, water as drink of choice, increase weekly exercise

## 2024-02-02 NOTE — Assessment & Plan Note (Signed)
 Chronic,  Referral placed to gen surg at previous visit Pt received call to schedule, but he is holding off until colonoscopy done Intermittently bothers patient - plans to follow up with gen surg

## 2024-02-02 NOTE — Assessment & Plan Note (Addendum)
 Acute cough related to recent positive Flu A on 01/28/24 Symptoms started on 01/25/24 Using OTC medications Cough still bothersome Intermittent fever with relief from tylenol and ibuprofen Breath sounds mild wheezing and course in upper lobes L tonsil swelling, L cervical lymph node swelling Will prescribe 6 days course of prednisone taper given cough, swollen tonsil, and wheezing Limit ibuprofen while on steroid. Promethazine DM prescribed for cough Continue hot tea with honey, throat lozenges, adequate water intake If symptoms continue to persist beyond 02/06/24 - discussed okay for antibiotic given length of symptoms (augmentin BID for 5 days)

## 2024-02-02 NOTE — Assessment & Plan Note (Addendum)
 Chronic, stable Continue daily naltrexone Pt as been able to abstain.  Continue thiamine, vitamin b12, vitamin d

## 2024-02-02 NOTE — Assessment & Plan Note (Signed)
 Chronic, stable Continue Sertraline 100mg  daily Pt plans to be seen at [facility] - referral placed last visit on 01/05/24

## 2024-02-03 ENCOUNTER — Other Ambulatory Visit: Payer: Self-pay

## 2024-02-03 ENCOUNTER — Other Ambulatory Visit: Payer: Self-pay | Admitting: Family Medicine

## 2024-02-03 DIAGNOSIS — R051 Acute cough: Secondary | ICD-10-CM

## 2024-02-03 MED ORDER — ALBUTEROL SULFATE HFA 108 (90 BASE) MCG/ACT IN AERS
2.0000 | INHALATION_SPRAY | Freq: Four times a day (QID) | RESPIRATORY_TRACT | 0 refills | Status: AC | PRN
  Filled 2024-02-03: qty 18, 25d supply, fill #0

## 2024-02-08 ENCOUNTER — Other Ambulatory Visit: Payer: Self-pay | Admitting: Gerontology

## 2024-02-08 ENCOUNTER — Other Ambulatory Visit: Payer: Self-pay

## 2024-02-08 DIAGNOSIS — Z8719 Personal history of other diseases of the digestive system: Secondary | ICD-10-CM

## 2024-02-09 ENCOUNTER — Ambulatory Visit: Payer: Self-pay | Admitting: Family Medicine

## 2024-02-09 ENCOUNTER — Other Ambulatory Visit: Payer: Self-pay

## 2024-02-09 ENCOUNTER — Other Ambulatory Visit: Payer: Self-pay | Admitting: Family Medicine

## 2024-02-09 DIAGNOSIS — Z8719 Personal history of other diseases of the digestive system: Secondary | ICD-10-CM

## 2024-02-09 MED FILL — Esomeprazole Magnesium Cap Delayed Release 20 MG (Base Eq): ORAL | 90 days supply | Qty: 90 | Fill #0 | Status: AC

## 2024-02-09 NOTE — Telephone Encounter (Signed)
 Copied from CRM [id number]. Topic: Clinical - Red Word Triage >> Feb 09, 2024 12:51 PM Marlow Baars wrote: Red Word that prompted transfer to Nurse Triage: The patient called in stating he was diagnosed with the flu 2 weeks ago and said he thought he was turning the corner as he was feeling some better other than a cough. He states he has once again gotten worse with a temperature, abdominal cramping and shortness of breath to go along with the cough. With that I will transfer him to E2C2 NT   Chief Complaint: Cough, Flu Symptoms: cough, wheezing, SOB when laying down, abd cramping Frequency: Flu symptoms ongoing for 2 weeks Pertinent Negatives: Patient denies fever at this time Disposition: [] ED /[x] Urgent Care (no appt availability in office) / [] Appointment(In office/virtual)/ []  [facility] [facility]/ [] Home Care/ [] Refused Recommended Disposition /[] [facility]/ []  Follow-up with PCP Additional Notes: Patient stated he was recently diagnosed with flu. He stated he is still having symptoms and the steroids that he was prescribed are not helping. He completed the treatment yesterday. This RN offered to schedule appt today and patient declined. Next available appt not until Wednesday. Patient decided he will go to urgent care.  Reason for Disposition  [1] Continuous (nonstop) coughing interferes with work or school AND [2] no improvement using cough treatment per Care Advice  Answer Assessment - Initial Assessment Questions 1. ONSET: "When did the cough begin?"      2 weeks ago  2. SEVERITY: "How bad is the cough today?"      Very bad cough  3. SPUTUM: "Describe the color of your sputum" (none, dry cough; clear, white, yellow, green)     Green  4. DIFFICULTY BREATHING: "Are you having difficulty breathing?" If Yes, ask: "How bad is it?" (e.g., mild, moderate, severe)    - MILD: No SOB at rest, mild SOB with walking, speaks normally in sentences, can lie down, no retractions, pulse <  100.    - MODERATE: SOB at rest, SOB with minimal exertion and prefers to sit, cannot lie down flat, speaks in phrases, mild retractions, audible wheezing, pulse 100-120.    - SEVERE: Very SOB at rest, speaks in single words, struggling to breathe, sitting hunched forward, retractions, pulse > 120      Shallow breathing when laying down, stuffy nose, congestion  5. FEVER: "Do you have a fever?" If Yes, ask: "What is your temperature, how was it measured, and when did it start?"     102 last night, took Ibuprofen, temp is 98 today   6. OTHER SYMPTOMS: "Do you have any other symptoms?" (e.g., runny nose, wheezing, chest pain)       Wheezing and runny nose, abd cramping comes and goes, currently 3/4 out of 10.  Protocols used: Cough - Acute Productive-A-AH

## 2024-02-10 ENCOUNTER — Ambulatory Visit: Admission: RE | Admit: 2024-02-10 | Payer: MEDICAID | Source: Home / Self Care | Admitting: Gastroenterology

## 2024-02-10 ENCOUNTER — Other Ambulatory Visit: Payer: Self-pay

## 2024-02-10 ENCOUNTER — Encounter: Admission: RE | Payer: Self-pay | Source: Home / Self Care

## 2024-02-10 SURGERY — COLONOSCOPY WITH PROPOFOL
Anesthesia: General

## 2024-02-10 MED ORDER — BENZONATATE 200 MG PO CAPS
200.0000 mg | ORAL_CAPSULE | Freq: Three times a day (TID) | ORAL | 0 refills | Status: DC
Start: 2024-02-10 — End: 2024-03-02
  Filled 2024-02-10: qty 20, 7d supply, fill #0

## 2024-02-10 MED ORDER — AMOXICILLIN 500 MG PO CAPS
1000.0000 mg | ORAL_CAPSULE | Freq: Three times a day (TID) | ORAL | 0 refills | Status: AC
  Filled 2024-02-10: qty 42, 7d supply, fill #0

## 2024-02-10 MED ORDER — DOXYCYCLINE HYCLATE 100 MG PO TABS
100.0000 mg | ORAL_TABLET | Freq: Two times a day (BID) | ORAL | 0 refills | Status: AC
Start: 2024-02-10 — End: 2024-02-17
  Filled 2024-02-10: qty 14, 7d supply, fill #0

## 2024-02-18 ENCOUNTER — Other Ambulatory Visit: Payer: Self-pay

## 2024-02-18 ENCOUNTER — Other Ambulatory Visit (HOSPITAL_COMMUNITY): Payer: Self-pay

## 2024-02-18 MED ORDER — BENZONATATE 100 MG PO CAPS
100.0000 mg | ORAL_CAPSULE | Freq: Three times a day (TID) | ORAL | 0 refills | Status: DC | PRN
  Filled 2024-02-18: qty 20, 4d supply, fill #0

## 2024-02-18 MED ORDER — AMOXICILLIN 500 MG PO CAPS
1000.0000 mg | ORAL_CAPSULE | Freq: Three times a day (TID) | ORAL | 0 refills | Status: DC
  Filled 2024-02-18: qty 42, 7d supply, fill #0

## 2024-02-18 MED ORDER — IBUPROFEN 600 MG PO TABS
600.0000 mg | ORAL_TABLET | Freq: Three times a day (TID) | ORAL | 0 refills | Status: AC
  Filled 2024-02-18: qty 30, 10d supply, fill #0

## 2024-02-18 MED ORDER — IBUPROFEN 600 MG PO TABS
600.0000 mg | ORAL_TABLET | Freq: Three times a day (TID) | ORAL | 0 refills | Status: DC
  Filled 2024-02-18: qty 30, 10d supply, fill #0

## 2024-02-18 MED ORDER — IPRATROPIUM BROMIDE 0.06 % NA SOLN
2.0000 | Freq: Three times a day (TID) | NASAL | 0 refills | Status: DC
Start: 2024-02-18 — End: 2024-03-02
  Filled 2024-02-18: qty 15, 25d supply, fill #0

## 2024-02-18 MED ORDER — DOXYCYCLINE HYCLATE 100 MG PO CAPS
100.0000 mg | ORAL_CAPSULE | Freq: Two times a day (BID) | ORAL | 0 refills | Status: DC
  Filled 2024-02-18: qty 14, 7d supply, fill #0

## 2024-02-18 MED ORDER — IPRATROPIUM BROMIDE 0.06 % NA SOLN
2.0000 | Freq: Three times a day (TID) | NASAL | 0 refills | Status: DC
Start: 2024-02-18 — End: 2024-02-18
  Filled 2024-02-18: qty 15, 25d supply, fill #0

## 2024-02-18 MED ORDER — DOXYCYCLINE HYCLATE 100 MG PO CAPS
100.0000 mg | ORAL_CAPSULE | Freq: Two times a day (BID) | ORAL | 0 refills | Status: DC
Start: 2024-02-18 — End: 2024-03-02
  Filled 2024-02-18 – 2024-03-02 (×2): qty 14, 7d supply, fill #0

## 2024-02-18 MED ORDER — AMOXICILLIN 500 MG PO CAPS
1000.0000 mg | ORAL_CAPSULE | Freq: Three times a day (TID) | ORAL | 0 refills | Status: AC
  Filled 2024-02-18: qty 42, 7d supply, fill #0

## 2024-02-18 MED ORDER — BENZONATATE 100 MG PO CAPS
100.0000 mg | ORAL_CAPSULE | Freq: Three times a day (TID) | ORAL | 0 refills | Status: DC
  Filled 2024-02-18: qty 20, 4d supply, fill #0

## 2024-02-19 ENCOUNTER — Other Ambulatory Visit (HOSPITAL_COMMUNITY): Payer: Self-pay

## 2024-02-27 NOTE — Progress Notes (Signed)
 Patient ID: Maurice Murray, male   DOB: 1971/03/30, 53 y.o.   MRN: [id number]  Chief Complaint: Right inguinal hernia  History of Present Illness Maurice Murray is a 53 y.o. male with a right inguinal hernia present for 2 years.  He reports his increased in size in the time, he is quite active with the physical job.  Increasing in soreness, along with the size of the bulge.  Reports spontaneously reducible, and totally absent when awakening in the morning.  Provoked by coughing, sneezing, being upright and slightly worse with lifting.  Denies any other abdominal pain, nausea vomiting bowel habit changes.  Has noted the various sounds of bowels within the hernia defect with sounds of fluid and gas bubbling.  No worsening tenderness during those times.  Past Medical History Past Medical History:  Diagnosis Date   Anxiety    Depression    GERD (gastroesophageal reflux disease)    Hypertension       Past Surgical History:  Procedure Laterality Date   GALLBLADDER SURGERY Right 2020   NECK SURGERY Left 01/30/2023   cyst removal    No Known Allergies  Current Outpatient Medications  Medication Sig Dispense Refill   albuterol (VENTOLIN HFA) 108 (90 Base) MCG/ACT inhaler Inhale 2 puffs into the lungs every 6 (six) hours as needed for wheezing or shortness of breath. 18 g 0   amLODipine (NORVASC) 10 MG tablet Take 1 tablet (10 mg total) by mouth daily. 90 tablet 1   cyanocobalamin (VITAMIN B12) 500 MCG tablet Take 1 tablet (500 mcg total) by mouth daily. 30 tablet 3   esomeprazole (NEXIUM) 20 MG capsule Take 1 capsule (20 mg total) by mouth daily. 90 capsule 3   ibuprofen (ADVIL) 600 MG tablet Take 1 tablet (600 mg total) by mouth in the morning, evening, and before bedtime. Do all this for 10 days. 30 tablet 0   Multiple Vitamin (MULTIVITAMIN WITH MINERALS) TABS tablet Take 1 tablet by mouth daily.     naltrexone (DEPADE) 50 MG tablet Take 50 mg by mouth daily.     ondansetron (ZOFRAN-ODT) 4 MG  disintegrating tablet Take 1 tablet (4 mg total) by mouth every 8 (eight) hours as needed. 20 tablet 0   promethazine-dextromethorphan (PROMETHAZINE-DM) 6.25-15 MG/5ML syrup Take 2.5 mLs by mouth 4 (four) times daily as needed for cough. 118 mL 0   sertraline (ZOLOFT) 100 MG tablet Take 1 tablet (100 mg total) by mouth daily. 30 tablet 2   No current facility-administered medications for this visit.    Family History Family History  Problem Relation Age of Onset   Alcohol abuse Mother    Cirrhosis Mother    Healthy Father    Crohn's disease Sister    Healthy Daughter    Alcohol abuse Maternal Grandmother    Hypertension Maternal Grandfather    Heart disease Maternal Grandfather    Lung cancer Paternal Grandmother        small cell   Hypertension Paternal Grandfather    Heart attack Paternal Grandfather 83      Social History Social History   Tobacco Use   Smoking status: Every Day    Current packs/day: 0.50    Average packs/day: 0.5 packs/day for 30.0 years (15.0 ttl pk-yrs)    Types: Cigarettes    Passive exposure: Past   Smokeless tobacco: Current   Tobacco comments:    Patient dips occassionally  Vaping Use   Vaping status: Never Used  Substance Use Topics  Alcohol use: Yes    Comment: last drank 11/16/2022, unable to list amounts, but "a lot of different things in large amounts"   Drug use: Never        Review of Systems  All other systems reviewed and are negative.    Physical Exam Blood pressure 100/65, pulse 79, temperature 98.1 F (36.7 C), height 6\' 1"  (1.854 m), weight 195 lb (88.5 kg), SpO2 97%. Last Weight  Most recent update: 03/02/2024 10:25 AM    Weight  88.5 kg (195 lb)             CONSTITUTIONAL: Well developed, and nourished, appropriately responsive and aware without distress.   EYES: Sclera non-icteric.   EARS, NOSE, MOUTH AND THROAT:  The oropharynx is clear. Oral mucosa is pink and moist.    Hearing is intact to voice.  NECK:  Trachea is midline, and there is no jugular venous distension.  LYMPH NODES:  Lymph nodes in the neck are not appreciated. RESPIRATORY:  Lungs are clear, and breath sounds are equal bilaterally.  Normal respiratory effort without pathologic use of accessory muscles. CARDIOVASCULAR: Heart is regular in rate and rhythm.   Well perfused.  GI: The abdomen is  soft, nontender, and nondistended. There were no palpable masses.  I did not appreciate hepatosplenomegaly.  GU: Very obvious right groin hernia, reducible.  Testes descended bilaterally.  I was not able to appreciate a left-sided inguinal hernia. MUSCULOSKELETAL:  Symmetrical muscle tone appreciated in all four extremities.    SKIN: Skin turgor is normal. No pathologic skin lesions appreciated.  Prior site of dermal cyst excision from the left neck, just slightly thickened scar present.  Feels he may have nicked it while shaving.  No clear-cut evidence of any recurrent cyst present there. NEUROLOGIC:  Motor and sensation appear grossly normal.  Cranial nerves are grossly without defect. PSYCH:  Alert and oriented to person, place and time. Affect is appropriate for situation.  Data Reviewed I have personally reviewed what is currently available of the patient's imaging, recent labs and medical records.   Labs:     Latest Ref Rng & Units 01/28/2024    4:19 PM 01/05/2024   10:11 AM 11/28/2023    5:43 AM  CBC  WBC 4.0 - 10.5 K/uL 15.8  6.9  5.4   Hemoglobin 13.0 - 17.0 g/dL 78.2  95.6  21.3   Hematocrit 39.0 - 52.0 % 45.8  42.5  35.1   Platelets 150 - 400 K/uL 147  233  37       Latest Ref Rng & Units 01/28/2024    4:19 PM 01/05/2024   10:11 AM 11/28/2023    5:43 AM  CMP  Glucose 70 - 99 mg/dL 086  578    BUN 6 - 20 mg/dL 10  6    Creatinine 4.69 - 1.24 mg/dL 6.29  5.28    Sodium 413 - 145 mmol/L 133  141    Potassium 3.5 - 5.1 mmol/L 3.3  4.1    Chloride 98 - 111 mmol/L 101  102    CO2 22 - 32 mmol/L 21  23    Calcium 8.9 - 10.3  mg/dL 8.8  9.0    Total Protein 6.0 - 8.5 g/dL  6.5  6.3   Total Bilirubin 0.0 - 1.2 mg/dL  0.3  1.7   Alkaline Phos 44 - 121 IU/L  58  72   AST 0 - 40 IU/L  24  211  ALT 0 - 44 IU/L  27  171     Imaging: Radiological images reviewed:   Within last 24 hrs: No results found.  Assessment    Right inguinal hernia. 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  We discussed robotic right inguinal hernia repair, possible bilateral. We discussed how the mesh is placed, alternatives, and postoperative expectations. I discussed possibility of incarceration, strangulation, enlargement in size over time, and the need for emergency surgery in the face of these.  Also reviewed the techniques of reduction should incarceration occur, and when unsuccessful to present to the ED.  Also discussed that surgery risks include recurrence which can be up to 30% in the case of complex hernias, use of prosthetic materials (mesh) and the increased risk of infection and the possible need for re-operation and removal of mesh, possibility of post-op SBO or ileus, and the risks of general  anesthetic including heart attack, stroke, sudden death or some reaction to anesthetic medications. The patient, and those present, appear to understand the risks, any and all questions were answered to the patient's satisfaction.  No guarantees were ever expressed or implied.    Face-to-face time spent with the patient and accompanying care providers(if present) was 30 minutes, spent counseling, educating, and coordinating care of the patient.    These notes generated with voice recognition software. I apologize for typographical errors.  Campbell Lerner M.D., FACS 03/02/2024, 11:56 AM

## 2024-03-02 ENCOUNTER — Other Ambulatory Visit: Payer: Self-pay

## 2024-03-02 ENCOUNTER — Other Ambulatory Visit: Payer: Self-pay | Admitting: Family Medicine

## 2024-03-02 ENCOUNTER — Ambulatory Visit: Payer: Self-pay | Admitting: Surgery

## 2024-03-02 ENCOUNTER — Encounter: Payer: Self-pay | Admitting: Surgery

## 2024-03-02 ENCOUNTER — Ambulatory Visit (INDEPENDENT_AMBULATORY_CARE_PROVIDER_SITE_OTHER): Payer: MEDICAID | Admitting: Surgery

## 2024-03-02 ENCOUNTER — Telehealth: Payer: Self-pay | Admitting: Surgery

## 2024-03-02 VITALS — BP 100/65 | HR 79 | Temp 98.1°F | Ht 73.0 in | Wt 195.0 lb

## 2024-03-02 DIAGNOSIS — F1090 Alcohol use, unspecified, uncomplicated: Secondary | ICD-10-CM

## 2024-03-02 DIAGNOSIS — K4090 Unilateral inguinal hernia, without obstruction or gangrene, not specified as recurrent: Secondary | ICD-10-CM | POA: Diagnosis not present

## 2024-03-02 MED FILL — Esomeprazole Magnesium Cap Delayed Release 20 MG (Base Eq): ORAL | 90 days supply | Qty: 90 | Fill #1 | Status: CN

## 2024-03-02 NOTE — Telephone Encounter (Signed)
 Patient has been advised of Pre-Admission date/time, and Surgery date at [facility].  Surgery Date: 04/14/24 Preadmission Testing Date: Preadmissions will call patient.  Patient informed that he will be getting several calls from different areas of the hospital regarding surgery.     Patient has been made aware to call [phone], between 1-3:00pm the day before surgery, to find out what time to arrive for surgery.

## 2024-03-02 NOTE — Patient Instructions (Signed)
 You have chose to have your hernia repaired. This will be done by Dr. Claudine Mouton at [facility].  If you are on any injectable weight loss medication, you will need to stop taking your GLP-1 injectable (weight loss) medications 8 days before your surgery to avoid any complications with anesthesia.   Please see your (blue) Pre-care information that you have been given today. Our surgery scheduler will call you to verify surgery date and to go over information.   You will need to arrange to be out of work for approximately 1-2 weeks and then you may return with a lifting restriction for 4 more weeks. If you have FMLA or Disability paperwork that needs to be filled out, please have your company fax your paperwork to [phone] or you may drop this by either office. This paperwork will be filled out within 3 days after your surgery has been completed.  You may have a bruise in your groin and also swelling and brusing in your testicle area. You may use ice 4-5 times daily for 15-20 minutes each time. Make sure that you place a barrier between you and the ice pack. To decrease the swelling, you may roll up a bath towel and place it vertically in between your thighs with your testicles resting on the towel. You will want to keep this area elevated as much as possible for several days following surgery.    Inguinal Hernia, Adult Muscles help keep everything in the body in its proper place. But if a weak spot in the muscles develops, something can poke through. That is called a hernia. When this happens in the lower part of the belly (abdomen), it is called an inguinal hernia. (It takes its name from a part of the body in this region called the inguinal canal.) A weak spot in the wall of muscles lets some fat or part of the small intestine bulge through. An inguinal hernia can develop at any age. Men get them more often than women. CAUSES  In adults, an inguinal hernia develops over time. It can be  triggered by: Suddenly straining the muscles of the lower abdomen. Lifting heavy objects. Straining to have a bowel movement. Difficult bowel movements (constipation) can lead to this. Constant coughing. This may be caused by smoking or lung disease. Being overweight. Being pregnant. Working at a job that requires long periods of standing or heavy lifting. Having had an inguinal hernia before. One type can be an emergency situation. It is called a strangulated inguinal hernia. It develops if part of the small intestine slips through the weak spot and cannot get back into the abdomen. The blood supply can be cut off. If that happens, part of the intestine may die. This situation requires emergency surgery. SYMPTOMS  Often, a small inguinal hernia has no symptoms. It is found when a healthcare provider does a physical exam. Larger hernias usually have symptoms.  In adults, symptoms may include: A lump in the groin. This is easier to see when the person is standing. It might disappear when lying down. In men, a lump in the scrotum. Pain or burning in the groin. This occurs especially when lifting, straining or coughing. A dull ache or feeling of pressure in the groin. Signs of a strangulated hernia can include: A bulge in the groin that becomes very painful and tender to the touch. A bulge that turns red or purple. Fever, nausea and vomiting. Inability to have a bowel movement or to pass gas.  DIAGNOSIS  To decide if you have an inguinal hernia, a healthcare provider will probably do a physical examination. This will include asking questions about any symptoms you have noticed. The healthcare provider might feel the groin area and ask you to cough. If an inguinal hernia is felt, the healthcare provider may try to slide it back into the abdomen. Usually no other tests are needed. TREATMENT  Treatments can vary. The size of the hernia makes a difference. Options include: Watchful waiting. This  is often suggested if the hernia is small and you have had no symptoms. No medical procedure will be done unless symptoms develop. You will need to watch closely for symptoms. If any occur, contact your healthcare provider right away. Surgery. This is used if the hernia is larger or you have symptoms. Open surgery. This is usually an outpatient procedure (you will not stay overnight in a hospital). An cut (incision) is made through the skin in the groin. The hernia is put back inside the abdomen. The weak area in the muscles is then repaired by herniorrhaphy or hernioplasty. Herniorrhaphy: in this type of surgery, the weak muscles are sewn back together. Hernioplasty: a patch or mesh is used to close the weak area in the abdominal wall. Laparoscopy. In this procedure, a surgeon makes small incisions. A thin tube with a tiny video camera (called a laparoscope) is put into the abdomen. The surgeon repairs the hernia with mesh by looking with the video camera and using two long instruments. HOME CARE INSTRUCTIONS  After surgery to repair an inguinal hernia: You will need to take pain medicine prescribed by your healthcare provider. Follow all directions carefully. You will need to take care of the wound from the incision. Your activity will be restricted for awhile. This will probably include no heavy lifting for several weeks. You also should not do anything too active for a few weeks. When you can return to work will depend on the type of job that you have. During "watchful waiting" periods, you should: Maintain a healthy weight. Eat a diet high in fiber (fruits, vegetables and whole grains). Drink plenty of fluids to avoid constipation. This means drinking enough water and other liquids to keep your urine clear or pale yellow. Do not lift heavy objects. Do not stand for long periods of time. Quit smoking. This should keep you from developing a frequent cough. SEEK MEDICAL CARE IF:  A bulge  develops in your groin area. You feel pain, a burning sensation or pressure in the groin. This might be worse if you are lifting or straining. You develop a fever of more than 100.5 F (38.1 C). SEEK IMMEDIATE MEDICAL CARE IF:  Pain in the groin increases suddenly. A bulge in the groin gets bigger suddenly and does not go down. For men, there is sudden pain in the scrotum. Or, the size of the scrotum increases. A bulge in the groin area becomes red or purple and is painful to touch. You have nausea or vomiting that does not go away. You feel your heart beating much faster than normal. You cannot have a bowel movement or pass gas. You develop a fever of more than 102.0 F (38.9 C).   This information is not intended to replace advice given to you by your health care provider. Make sure you discuss any questions you have with your health care provider.   Document Released: 04/06/2009 Document Revised: 02/10/2012 Document Reviewed: 05/22/2015 Elsevier Interactive Patient Education [employer]

## 2024-03-05 ENCOUNTER — Other Ambulatory Visit: Payer: Self-pay | Admitting: Family Medicine

## 2024-03-05 DIAGNOSIS — F1090 Alcohol use, unspecified, uncomplicated: Secondary | ICD-10-CM

## 2024-03-09 ENCOUNTER — Other Ambulatory Visit: Payer: Self-pay

## 2024-03-09 MED ORDER — ADULT MULTIVITAMIN W/MINERALS CH
1.0000 | ORAL_TABLET | Freq: Every day | ORAL | 3 refills | Status: AC
  Filled 2024-03-09: qty 90, 90d supply, fill #0

## 2024-03-09 MED ORDER — NALTREXONE HCL 50 MG PO TABS
50.0000 mg | ORAL_TABLET | Freq: Every day | ORAL | 0 refills | Status: AC
  Filled 2024-03-09: qty 30, 30d supply, fill #0

## 2024-03-31 ENCOUNTER — Emergency Department
Admission: EM | Admit: 2024-03-31 | Discharge: 2024-04-01 | DRG: 897 | Discharge status: Against Medical Advice | Payer: MEDICAID | Attending: Family Medicine | Admitting: Family Medicine

## 2024-03-31 ENCOUNTER — Encounter: Payer: Self-pay | Admitting: Emergency Medicine

## 2024-03-31 ENCOUNTER — Other Ambulatory Visit: Payer: Self-pay

## 2024-03-31 DIAGNOSIS — Z5321 Procedure and treatment not carried out due to patient leaving prior to being seen by health care provider: Secondary | ICD-10-CM | POA: Diagnosis not present

## 2024-03-31 DIAGNOSIS — F32A Depression, unspecified: Secondary | ICD-10-CM | POA: Diagnosis not present

## 2024-03-31 DIAGNOSIS — F419 Anxiety disorder, unspecified: Secondary | ICD-10-CM | POA: Diagnosis present

## 2024-03-31 DIAGNOSIS — F10230 Alcohol dependence with withdrawal, uncomplicated: Principal | ICD-10-CM | POA: Diagnosis present

## 2024-03-31 DIAGNOSIS — I10 Essential (primary) hypertension: Secondary | ICD-10-CM | POA: Diagnosis not present

## 2024-03-31 DIAGNOSIS — F10930 Alcohol use, unspecified with withdrawal, uncomplicated: Principal | ICD-10-CM

## 2024-03-31 DIAGNOSIS — F10939 Alcohol use, unspecified with withdrawal, unspecified: Secondary | ICD-10-CM | POA: Diagnosis present

## 2024-03-31 DIAGNOSIS — Y908 Blood alcohol level of 240 mg/100 ml or more: Secondary | ICD-10-CM | POA: Diagnosis present

## 2024-03-31 HISTORY — DX: Alcohol abuse, uncomplicated: F10.10

## 2024-03-31 LAB — ETHANOL: Alcohol, Ethyl (B): 435 mg/dL (ref <15)

## 2024-03-31 LAB — CBC
HCT: 45.9 % (ref 39.0–52.0)
Hemoglobin: 16.1 g/dL (ref 13.0–17.0)
MCH: 32.4 pg (ref 26.0–34.0)
MCHC: 35.1 g/dL (ref 30.0–36.0)
MCV: 92.4 fL (ref 80.0–100.0)
Platelets: 203 10*3/uL (ref 150–400)
RBC: 4.97 MIL/uL (ref 4.22–5.81)
RDW: 13.2 % (ref 11.5–15.5)
WBC: 11.2 10*3/uL — ABNORMAL HIGH (ref 4.0–10.5)
nRBC: 0 % (ref 0.0–0.2)

## 2024-03-31 LAB — COMPREHENSIVE METABOLIC PANEL WITH GFR
ALT: 445 U/L — ABNORMAL HIGH (ref 0–44)
AST: 529 U/L — ABNORMAL HIGH (ref 15–41)
Albumin: 4.7 g/dL (ref 3.5–5.0)
Alkaline Phosphatase: 71 U/L (ref 38–126)
Anion gap: 19 — ABNORMAL HIGH (ref 5–15)
BUN: 16 mg/dL (ref 6–20)
CO2: 24 mmol/L (ref 22–32)
Calcium: 8.8 mg/dL — ABNORMAL LOW (ref 8.9–10.3)
Chloride: 93 mmol/L — ABNORMAL LOW (ref 98–111)
Creatinine, Ser: 0.62 mg/dL (ref 0.61–1.24)
GFR, Estimated: >60 mL/min (ref >60)
Glucose, Bld: 115 mg/dL — ABNORMAL HIGH (ref 70–99)
Potassium: 3.8 mmol/L (ref 3.5–5.1)
Sodium: 136 mmol/L (ref 135–145)
Total Bilirubin: 2.2 mg/dL — ABNORMAL HIGH (ref 0.0–1.2)
Total Protein: 8 g/dL (ref 6.5–8.1)

## 2024-03-31 NOTE — ED Notes (Signed)
 Pt was seen walking towards the exit. This nurse asked if he needed anything, states "I am looking to leave." This RN encouraged the pt to stay until seen by a provided. Pt states "I  don't want to stay." This RN watched the pt walk towards the lobby.

## 2024-03-31 NOTE — ED Provider Notes (Addendum)
 [facility] Provider Note    Event Date/Time   First MD Initiated Contact with Patient 03/31/24 2300     (approximate)   History   Alcohol Problem   HPI  Maurice Murray is a 53 y.o. male with history of alcohol abuse, anxiety, depression, hypertension, and GERD who presents with alcohol abuse and concern for withdrawal.  The patient states that he has been binging heavily over approximate last 4 days.  He states that although his last drink was several hours ago he already does feel like he is withdrawing.  He reports feeling shaky, nauseous, and anxious similar to prior episodes of withdrawal.  The past medical records.  The patient was admitted to the hospitalist service in December of last year with alcohol withdrawal and delirium.   Physical Exam   Triage Vital Signs: ED Triage Vitals  Encounter Vitals Group     BP 03/31/24 2248 (!) 131/95     Systolic BP Percentile --      Diastolic BP Percentile --      Pulse Rate 03/31/24 2248 (!) 135     Resp 03/31/24 2248 18     Temp 03/31/24 2248 99.3 F (37.4 C)     Temp src --      SpO2 03/31/24 2248 95 %     Weight 03/31/24 2249 195 lb (88.5 kg)     Height 03/31/24 2249 6\' 1"  (1.854 m)     Head Circumference --      Peak Flow --      Pain Score 03/31/24 2249 0     Pain Loc --      Pain Education --      Exclude from Growth Chart --     Most recent vital signs: Vitals:   04/01/24 0139 04/01/24 0415  BP: 98/85   Pulse: (!) 130   Resp:    Temp:  98 F (36.7 C)  SpO2:       General: Alert, anxious appearing, no distress.  CV:  Good peripheral perfusion.  Resp:  Normal effort.  Abd:  No distention.  Other:  Calm and cooperative.   ED Results / Procedures / Treatments   Labs (all labs ordered are listed, but only abnormal results are displayed) Labs Reviewed  COMPREHENSIVE METABOLIC PANEL WITH GFR - Abnormal; Notable for the following components:      Result Value   Chloride 93 (*)     Glucose, Bld 115 (*)    Calcium 8.8 (*)    AST 529 (*)    ALT 445 (*)    Total Bilirubin 2.2 (*)    Anion gap 19 (*)    All other components within normal limits  ETHANOL - Abnormal; Notable for the following components:   Alcohol, Ethyl (B) 435 (*)    All other components within normal limits  CBC - Abnormal; Notable for the following components:   WBC 11.2 (*)    All other components within normal limits  URINE DRUG SCREEN, QUALITATIVE ([facility] ONLY)  URINALYSIS, ROUTINE W REFLEX MICROSCOPIC     EKG    RADIOLOGY    PROCEDURES:  Critical Care performed: No  Procedures   MEDICATIONS ORDERED IN ED: Medications  LORazepam  (ATIVAN ) injection 0-4 mg (1 mg Intravenous Given 04/01/24 0053)    Or  LORazepam  (ATIVAN ) tablet 0-4 mg ( Oral See Alternative 04/01/24 0053)  LORazepam  (ATIVAN ) injection 0-4 mg (has no administration in time range)    Or  LORazepam  (ATIVAN ) tablet 0-4 mg (has no administration in time range)  thiamine  (VITAMIN B1) tablet 100 mg (has no administration in time range)    Or  thiamine  (VITAMIN B1) injection 100 mg (has no administration in time range)  LORazepam  (ATIVAN ) injection 2 mg (2 mg Intravenous Given 04/01/24 0026)  ondansetron  (ZOFRAN ) injection 4 mg (4 mg Intravenous Given 04/01/24 0027)  sodium chloride  0.9 % bolus 1,000 mL (0 mLs Intravenous Stopped 04/01/24 0134)  LORazepam  (ATIVAN ) injection 2 mg (2 mg Intravenous Given 04/01/24 0405)     IMPRESSION / MDM / ASSESSMENT AND PLAN / ED COURSE  I reviewed the triage vital signs and the nursing notes.  53 year old male with PMH as noted above presents with worsening alcohol abuse and concern for withdrawal over the last several hours.  On exam the patient is tachycardic, slightly hypertensive, and is quite anxious appearing.  Physical exam is otherwise unremarkable for acute findings.  Ethanol level at triage was 435 although the patient is alert and appears clinically sober.  LFTs and bilirubin are  elevated although this appears somewhat chronic.  Anion gap is elevated.  Differential diagnosis includes, but is not limited to, alcohol withdrawal, alcoholic ketoacidosis, dehydration, electrolyte abnormality.  We will give fluids, Ativan , start the patient on the CIWA protocol, and reassess.  Patient's presentation is most consistent with acute presentation with potential threat to life or bodily function.  The patient is on the cardiac monitor to evaluate for evidence of arrhythmia and/or significant heart rate changes.  ----------------------------------------- 3:14 AM on 04/01/2024 -----------------------------------------  Blood pressure is controlled with the patient remains significantly tachycardic despite Ativan  and fluids.  He will need admission for further management of alcohol withdrawal.  I consulted Dr. Achilles Holes from the hospitalist service; based on our discussion he agrees to evaluate the patient for admission.  ----------------------------------------- 4:10 AM on 04/01/2024 -----------------------------------------  The patient is now saying that he wants to leave AGAINST MEDICAL ADVICE and no longer wants to be admitted.  He states that he is not feeling significantly better and does not want to be in the hospital.  I had an extensive discussion with him about the treatment plan and risks of progression of his alcohol withdrawal without further treatment.  The patient initially agreed to proceed with admission but then again decided to go home.  The patient demonstrates full decision-making capacity.  I explained to him risks of alcohol withdrawal including, but not limited to, altered mental status, hallucinations, seizures, permanent brain damage, permanent disability, and death.  The patient expressed understanding and was able to paraphrase these back to me.  I offered to prescribe Librium  as a temporizing measure, and have also provided a list of outpatient substance abuse  resources.  I advised the patient that he may return to anytime if he simply changes his mind, and also gave specific return precautions related to alcohol withdrawal.  He expressed understanding and agreement.   FINAL CLINICAL IMPRESSION(S) / ED DIAGNOSES   Final diagnoses:  Alcohol withdrawal syndrome without complication (HCC)     Rx / DC Orders   ED Discharge Orders          Ordered    chlordiazePOXIDE  (LIBRIUM ) 25 MG capsule  3 times daily PRN        04/01/24 0410             Note:  This document was prepared using Dragon voice recognition software and may include unintentional dictation errors.    Terrel Nesheiwat,  Rogelia Clarks, MD 04/01/24 4098    Lind Repine, MD 04/01/24 [phone]

## 2024-03-31 NOTE — ED Triage Notes (Signed)
 Pt in via POV, reports binge drinking over the last 4 days, having approximately a fifth of liquor daily.  Ambulatory to triage, NAD noted at this time.

## 2024-04-01 DIAGNOSIS — I10 Essential (primary) hypertension: Secondary | ICD-10-CM | POA: Diagnosis present

## 2024-04-01 DIAGNOSIS — F10230 Alcohol dependence with withdrawal, uncomplicated: Secondary | ICD-10-CM | POA: Diagnosis present

## 2024-04-01 DIAGNOSIS — Z5321 Procedure and treatment not carried out due to patient leaving prior to being seen by health care provider: Secondary | ICD-10-CM | POA: Diagnosis present

## 2024-04-01 DIAGNOSIS — F10939 Alcohol use, unspecified with withdrawal, unspecified: Secondary | ICD-10-CM | POA: Diagnosis present

## 2024-04-01 DIAGNOSIS — F32A Depression, unspecified: Secondary | ICD-10-CM | POA: Diagnosis present

## 2024-04-01 DIAGNOSIS — F419 Anxiety disorder, unspecified: Secondary | ICD-10-CM | POA: Diagnosis present

## 2024-04-01 DIAGNOSIS — Y908 Blood alcohol level of 240 mg/100 ml or more: Secondary | ICD-10-CM | POA: Diagnosis present

## 2024-04-01 LAB — URINE DRUG SCREEN, QUALITATIVE (ARMC ONLY)
Amphetamines, Ur Screen: NOT DETECTED
Barbiturates, Ur Screen: NOT DETECTED
Benzodiazepine, Ur Scrn: NOT DETECTED
Cannabinoid 50 Ng, Ur ~~LOC~~: NOT DETECTED
Cocaine Metabolite,Ur ~~LOC~~: NOT DETECTED
MDMA (Ecstasy)Ur Screen: NOT DETECTED
Methadone Scn, Ur: NOT DETECTED
Opiate, Ur Screen: NOT DETECTED
Phencyclidine (PCP) Ur S: NOT DETECTED
Tricyclic, Ur Screen: NOT DETECTED

## 2024-04-01 MED ORDER — THIAMINE HCL 100 MG/ML IJ SOLN: 100.0000 mg | Freq: Every day | INTRAMUSCULAR | Status: DC

## 2024-04-01 MED ORDER — LORAZEPAM 2 MG/ML IJ SOLN
0.0000 mg | Freq: Four times a day (QID) | INTRAMUSCULAR | Status: DC
  Administered 2024-04-01: 1 mg via INTRAVENOUS
  Filled 2024-04-01: qty 1

## 2024-04-01 MED ORDER — THIAMINE MONONITRATE 100 MG PO TABS: 100.0000 mg | ORAL_TABLET | Freq: Every day | ORAL | Status: DC

## 2024-04-01 MED ORDER — LORAZEPAM 2 MG/ML IJ SOLN: 0.0000 mg | Freq: Two times a day (BID) | INTRAMUSCULAR | Status: DC

## 2024-04-01 MED ORDER — ONDANSETRON HCL 4 MG/2ML IJ SOLN
4.0000 mg | Freq: Once | INTRAMUSCULAR | Status: AC
  Administered 2024-04-01: 4 mg via INTRAVENOUS
  Filled 2024-04-01: qty 2

## 2024-04-01 MED ORDER — CHLORDIAZEPOXIDE HCL 25 MG PO CAPS
25.0000 mg | ORAL_CAPSULE | Freq: Three times a day (TID) | ORAL | 0 refills | Status: AC | PRN
Start: 2024-04-01 — End: ?

## 2024-04-01 MED ORDER — LORAZEPAM 2 MG/ML IJ SOLN
2.0000 mg | Freq: Once | INTRAMUSCULAR | Status: AC
  Administered 2024-04-01: 2 mg via INTRAVENOUS
  Filled 2024-04-01: qty 1

## 2024-04-01 MED ORDER — SODIUM CHLORIDE 0.9 % IV BOLUS
1000.0000 mL | Freq: Once | INTRAVENOUS | Status: AC
  Administered 2024-04-01: 1000 mL via INTRAVENOUS

## 2024-04-01 MED ORDER — LORAZEPAM 2 MG PO TABS: 0.0000 mg | ORAL_TABLET | Freq: Two times a day (BID) | ORAL | Status: DC

## 2024-04-01 MED ORDER — LORAZEPAM 2 MG PO TABS: 0.0000 mg | ORAL_TABLET | Freq: Four times a day (QID) | ORAL | Status: DC

## 2024-04-01 NOTE — Discharge Instructions (Addendum)
 You may return anytime if you change your mind and wish to resume your treatment in the hospital.  You should return immediately if you feel new or worsening anxiety, nausea, shakiness, tremors, weakness, or any other new or worsening symptoms that concern you.

## 2024-04-02 ENCOUNTER — Emergency Department
Admission: EM | Admit: 2024-04-02 | Discharge: 2024-04-02 | Discharge status: Against Medical Advice | Payer: MEDICAID | Source: Home / Self Care | Attending: Emergency Medicine | Admitting: Emergency Medicine

## 2024-04-02 ENCOUNTER — Emergency Department: Payer: MEDICAID

## 2024-04-02 ENCOUNTER — Other Ambulatory Visit: Payer: Self-pay

## 2024-04-02 ENCOUNTER — Emergency Department
Admission: EM | Admit: 2024-04-02 | Discharge: 2024-04-02 | Disposition: A | Discharge status: Home / Self Care | Payer: MEDICAID | Attending: Emergency Medicine | Admitting: Emergency Medicine

## 2024-04-02 DIAGNOSIS — I10 Essential (primary) hypertension: Secondary | ICD-10-CM | POA: Insufficient documentation

## 2024-04-02 DIAGNOSIS — R112 Nausea with vomiting, unspecified: Secondary | ICD-10-CM | POA: Diagnosis present

## 2024-04-02 DIAGNOSIS — F10188 Alcohol abuse with other alcohol-induced disorder: Secondary | ICD-10-CM | POA: Diagnosis not present

## 2024-04-02 DIAGNOSIS — E8729 Other acidosis: Secondary | ICD-10-CM

## 2024-04-02 DIAGNOSIS — Z5321 Procedure and treatment not carried out due to patient leaving prior to being seen by health care provider: Secondary | ICD-10-CM | POA: Insufficient documentation

## 2024-04-02 DIAGNOSIS — F1090 Alcohol use, unspecified, uncomplicated: Secondary | ICD-10-CM | POA: Insufficient documentation

## 2024-04-02 DIAGNOSIS — R7401 Elevation of levels of liver transaminase levels: Secondary | ICD-10-CM | POA: Diagnosis not present

## 2024-04-02 DIAGNOSIS — E872 Acidosis, unspecified: Secondary | ICD-10-CM | POA: Insufficient documentation

## 2024-04-02 DIAGNOSIS — R531 Weakness: Secondary | ICD-10-CM | POA: Insufficient documentation

## 2024-04-02 DIAGNOSIS — Y908 Blood alcohol level of 240 mg/100 ml or more: Secondary | ICD-10-CM | POA: Diagnosis not present

## 2024-04-02 LAB — COMPREHENSIVE METABOLIC PANEL WITH GFR
ALT: 442 U/L — ABNORMAL HIGH (ref 0–44)
AST: 494 U/L — ABNORMAL HIGH (ref 15–41)
Albumin: 4.4 g/dL (ref 3.5–5.0)
Alkaline Phosphatase: 76 U/L (ref 38–126)
Anion gap: 19 — ABNORMAL HIGH (ref 5–15)
BUN: 15 mg/dL (ref 6–20)
CO2: 23 mmol/L (ref 22–32)
Calcium: 8.6 mg/dL — ABNORMAL LOW (ref 8.9–10.3)
Chloride: 96 mmol/L — ABNORMAL LOW (ref 98–111)
Creatinine, Ser: 0.62 mg/dL (ref 0.61–1.24)
GFR, Estimated: >60 mL/min (ref >60)
Glucose, Bld: 110 mg/dL — ABNORMAL HIGH (ref 70–99)
Potassium: 3.4 mmol/L — ABNORMAL LOW (ref 3.5–5.1)
Sodium: 138 mmol/L (ref 135–145)
Total Bilirubin: 2.2 mg/dL — ABNORMAL HIGH (ref 0.0–1.2)
Total Protein: 7.2 g/dL (ref 6.5–8.1)

## 2024-04-02 LAB — URINE DRUG SCREEN, QUALITATIVE (ARMC ONLY)
Amphetamines, Ur Screen: NOT DETECTED
Barbiturates, Ur Screen: NOT DETECTED
Benzodiazepine, Ur Scrn: POSITIVE — AB
Cannabinoid 50 Ng, Ur ~~LOC~~: NOT DETECTED
Cocaine Metabolite,Ur ~~LOC~~: NOT DETECTED
MDMA (Ecstasy)Ur Screen: NOT DETECTED
Methadone Scn, Ur: NOT DETECTED
Opiate, Ur Screen: NOT DETECTED
Phencyclidine (PCP) Ur S: NOT DETECTED
Tricyclic, Ur Screen: NOT DETECTED

## 2024-04-02 LAB — ETHANOL: Alcohol, Ethyl (B): 351 mg/dL (ref <15)

## 2024-04-02 LAB — CBC
HCT: 43.3 % (ref 39.0–52.0)
Hemoglobin: 14.8 g/dL (ref 13.0–17.0)
MCH: 32.2 pg (ref 26.0–34.0)
MCHC: 34.2 g/dL (ref 30.0–36.0)
MCV: 94.1 fL (ref 80.0–100.0)
Platelets: 150 10*3/uL (ref 150–400)
RBC: 4.6 MIL/uL (ref 4.22–5.81)
RDW: 13.3 % (ref 11.5–15.5)
WBC: 9.2 10*3/uL (ref 4.0–10.5)
nRBC: 0 % (ref 0.0–0.2)

## 2024-04-02 LAB — TROPONIN I (HIGH SENSITIVITY): Troponin I (High Sensitivity): 13 ng/L (ref <18)

## 2024-04-02 LAB — D-DIMER, QUANTITATIVE: D-Dimer, Quant: 2.33 ug{FEU}/mL — ABNORMAL HIGH (ref 0.00–0.50)

## 2024-04-02 MED ORDER — ONDANSETRON HCL 4 MG/2ML IJ SOLN
4.0000 mg | Freq: Once | INTRAMUSCULAR | Status: AC
  Administered 2024-04-02: 4 mg via INTRAVENOUS
  Filled 2024-04-02: qty 2

## 2024-04-02 MED ORDER — SODIUM CHLORIDE 0.9 % IV BOLUS
1000.0000 mL | Freq: Once | INTRAVENOUS | Status: AC
  Administered 2024-04-02: 1000 mL via INTRAVENOUS

## 2024-04-02 MED ORDER — IOHEXOL 350 MG/ML SOLN
75.0000 mL | Freq: Once | INTRAVENOUS | Status: AC | PRN
  Administered 2024-04-02: 75 mL via INTRAVENOUS

## 2024-04-02 MED ORDER — LORAZEPAM 1 MG PO TABS
1.0000 mg | ORAL_TABLET | Freq: Once | ORAL | Status: AC
  Administered 2024-04-02: 1 mg via ORAL
  Filled 2024-04-02: qty 1

## 2024-04-02 NOTE — ED Notes (Signed)
 Pt ambulatory to the bathroom at this time.

## 2024-04-02 NOTE — ED Notes (Signed)
 Pt to CT at this time.

## 2024-04-02 NOTE — ED Triage Notes (Signed)
 Pt arrives via POV from home due to drinking to much. Pt is here for a second time today, pt sts that his s/s are the same as they were before. Pt sts that he drank half a gallon at home.

## 2024-04-02 NOTE — ED Provider Notes (Addendum)
 [facility] Provider Note    Event Date/Time   First MD Initiated Contact with Patient 04/02/24 [phone]     (approximate)  History   Chief Complaint: Alcohol Problem  HPI  Maurice Murray is a 53 y.o. male with a past medical history of alcohol abuse, anxiety, gastric reflux, hypertension, depression, presents to the emergency department for nausea vomiting weakness.  According to the patient he drinks a significant amount of alcohol up to around 1/5 or more per day of liquor.  His last drink he says was approximately 1 hour ago now.  Patient states he has not been feeling well, has been feeling lightheaded and weak and nauseated with occasional vomiting recently.  Patient states this morning he felt like he was going to pass out so he came to the emergency department for evaluation.  Patient admits significant alcohol use, states he does not want to be admitted and does not want to go to rehab or detox, patient states he is hoping that he could feel better and go home.  Patient also states he knows he needs to stop drinking and is hoping to do so on his own at home.  Physical Exam   Triage Vital Signs: ED Triage Vitals [04/02/24 0711]  Encounter Vitals Group     BP 129/89     Systolic BP Percentile      Diastolic BP Percentile      Pulse Rate (!) 117     Resp 19     Temp 98.2 F (36.8 C)     Temp Source Oral     SpO2 95 %     Weight 195 lb 1.7 oz (88.5 kg)     Height 6\' 1"  (1.854 m)     Head Circumference      Peak Flow      Pain Score 0     Pain Loc      Pain Education      Exclude from Growth Chart     Most recent vital signs: Vitals:   04/02/24 0711  BP: 129/89  Pulse: (!) 117  Resp: 19  Temp: 98.2 F (36.8 C)  SpO2: 95%    General: Awake, no distress.  CV:  Good peripheral perfusion.  Regular rhythm rate around 120 bpm. Resp:  Normal effort.  Equal breath sounds bilaterally.  No wheeze rales or rhonchi. Abd:  No distention.  Soft,  nontender.  No rebound or guarding.  ED Results / Procedures / Treatments   RADIOLOGY  I have reviewed interpret the chest x-ray images.  No obvious significant consolidation seen on my evaluation. Radiologist read the x-ray is negative   MEDICATIONS ORDERED IN ED: Medications  sodium chloride  0.9 % bolus 1,000 mL (has no administration in time range)  ondansetron  (ZOFRAN ) injection 4 mg (has no administration in time range)     IMPRESSION / MDM / ASSESSMENT AND PLAN / ED COURSE  I reviewed the triage vital signs and the nursing notes.  Patient's presentation is most consistent with acute presentation with potential threat to life or bodily function.  Patient presents to the emergency department for generalized weakness fatigue nausea, states he felt like he is going to pass out.  Patient does admit to significant alcohol use last which was about an hour ago.  Patient noted to be tachycardic also has a room air saturation between 90 and 95%.  No wheeze rales or rhonchi.  Will obtain a chest x-ray to evaluate  for pneumonia/aspiration.  We will check broad labs to evaluate electrolytes, alcohol level, blood counts.  Will also obtain a D-dimer as a precaution although patient denies any chest pain.  We will IV hydrate treat with Zofran  while awaiting lab results.  Also given the patient's significant alcohol use and generalized weakness we will obtain a CT scan of the head to rule out intracranial abnormality such as bleed.  Lab work has resulted showing an elevated D-dimer of 2.3 we will obtain a CTA of the chest.  Troponin negative, urine drug screen benzo positive only, CBC normal chemistry shows LFT elevation slight anion gap elevation highly suspect more alcoholic ketoacidosis.  I reviewed the patient's labs he has had liver enzyme elevation similarly in the past.  Patient has received IV fluids as well as Ativan .  Blood alcohol did come back significantly elevated at 351.  CT scan of the  head is negative.  CTA of the chest is negative.  Patient is requesting the nurse remove the IV immediately so that he can leave.  Does not want to stay for further workup or treatment.  I updated the patient on the results.  He states he is going to try to stop drinking on his own at home.  He has nausea medication to take at home per patient.  He is not driving per patient.  FINAL CLINICAL IMPRESSION(S) / ED DIAGNOSES   Alcohol use disorder Weakness Alcoholic ketoacidosis  Note:  This document was prepared using Dragon voice recognition software and may include unintentional dictation errors.    Ruth Cove, MD 04/02/24 1140

## 2024-04-02 NOTE — ED Triage Notes (Signed)
 Patient comes in via pov with complaints of drinking again. Pt's states that his last drink was 20 minutes ago. Pt drank half a gallon of alcohol. Pt complains of no pain at this time, but feels dizzy and is concerned about his mental health due to his alcohol use. Pt is tachycardic at 117. Pt is alert and oriented x4, and cooperative in triage.

## 2024-04-03 ENCOUNTER — Other Ambulatory Visit: Payer: Self-pay

## 2024-04-03 ENCOUNTER — Emergency Department
Admission: EM | Admit: 2024-04-03 | Discharge: 2024-04-03 | Discharge status: Against Medical Advice | Payer: MEDICAID | Attending: Emergency Medicine | Admitting: Emergency Medicine

## 2024-04-03 ENCOUNTER — Emergency Department
Admission: EM | Admit: 2024-04-03 | Discharge: 2024-04-03 | Disposition: A | Discharge status: Home / Self Care | Payer: MEDICAID | Attending: Emergency Medicine | Admitting: Emergency Medicine

## 2024-04-03 DIAGNOSIS — F10920 Alcohol use, unspecified with intoxication, uncomplicated: Secondary | ICD-10-CM

## 2024-04-03 DIAGNOSIS — Y908 Blood alcohol level of 240 mg/100 ml or more: Secondary | ICD-10-CM | POA: Insufficient documentation

## 2024-04-03 DIAGNOSIS — R519 Headache, unspecified: Secondary | ICD-10-CM | POA: Insufficient documentation

## 2024-04-03 DIAGNOSIS — F10129 Alcohol abuse with intoxication, unspecified: Secondary | ICD-10-CM | POA: Insufficient documentation

## 2024-04-03 DIAGNOSIS — F10120 Alcohol abuse with intoxication, uncomplicated: Secondary | ICD-10-CM | POA: Insufficient documentation

## 2024-04-03 DIAGNOSIS — F1010 Alcohol abuse, uncomplicated: Secondary | ICD-10-CM

## 2024-04-03 DIAGNOSIS — I10 Essential (primary) hypertension: Secondary | ICD-10-CM | POA: Insufficient documentation

## 2024-04-03 DIAGNOSIS — R000 Tachycardia, unspecified: Secondary | ICD-10-CM | POA: Diagnosis not present

## 2024-04-03 DIAGNOSIS — Y909 Presence of alcohol in blood, level not specified: Secondary | ICD-10-CM | POA: Diagnosis not present

## 2024-04-03 DIAGNOSIS — Z5329 Procedure and treatment not carried out because of patient's decision for other reasons: Secondary | ICD-10-CM | POA: Insufficient documentation

## 2024-04-03 LAB — COMPREHENSIVE METABOLIC PANEL WITH GFR
ALT: 290 U/L — ABNORMAL HIGH (ref 0–44)
AST: 290 U/L — ABNORMAL HIGH (ref 15–41)
Albumin: 3.6 g/dL (ref 3.5–5.0)
Alkaline Phosphatase: 91 U/L (ref 38–126)
Anion gap: 14 (ref 5–15)
BUN: 12 mg/dL (ref 6–20)
CO2: 22 mmol/L (ref 22–32)
Calcium: 8.8 mg/dL — ABNORMAL LOW (ref 8.9–10.3)
Chloride: 100 mmol/L (ref 98–111)
Creatinine, Ser: 0.56 mg/dL — ABNORMAL LOW (ref 0.61–1.24)
GFR, Estimated: >60 mL/min (ref >60)
Glucose, Bld: 191 mg/dL — ABNORMAL HIGH (ref 70–99)
Potassium: 3.2 mmol/L — ABNORMAL LOW (ref 3.5–5.1)
Sodium: 136 mmol/L (ref 135–145)
Total Bilirubin: 1.2 mg/dL (ref 0.0–1.2)
Total Protein: 6.1 g/dL — ABNORMAL LOW (ref 6.5–8.1)

## 2024-04-03 LAB — URINALYSIS, ROUTINE W REFLEX MICROSCOPIC
Bacteria, UA: NONE SEEN
Bilirubin Urine: NEGATIVE
Glucose, UA: NEGATIVE mg/dL
Ketones, ur: NEGATIVE mg/dL
Leukocytes,Ua: NEGATIVE
Nitrite: NEGATIVE
Protein, ur: 30 mg/dL — AB
Specific Gravity, Urine: 1.009 (ref 1.005–1.030)
pH: 6 (ref 5.0–8.0)

## 2024-04-03 LAB — CBC WITH DIFFERENTIAL/PLATELET
Abs Immature Granulocytes: 0.02 10*3/uL (ref 0.00–0.07)
Basophils Absolute: 0.1 10*3/uL (ref 0.0–0.1)
Basophils Relative: 1 %
Eosinophils Absolute: 0.4 10*3/uL (ref 0.0–0.5)
Eosinophils Relative: 6 %
HCT: 37.1 % — ABNORMAL LOW (ref 39.0–52.0)
Hemoglobin: 12.7 g/dL — ABNORMAL LOW (ref 13.0–17.0)
Immature Granulocytes: 0 %
Lymphocytes Relative: 33 %
Lymphs Abs: 2.3 10*3/uL (ref 0.7–4.0)
MCH: 32.7 pg (ref 26.0–34.0)
MCHC: 34.2 g/dL (ref 30.0–36.0)
MCV: 95.6 fL (ref 80.0–100.0)
Monocytes Absolute: 0.5 10*3/uL (ref 0.1–1.0)
Monocytes Relative: 7 %
Neutro Abs: 3.8 10*3/uL (ref 1.7–7.7)
Neutrophils Relative %: 53 %
Platelets: 105 10*3/uL — ABNORMAL LOW (ref 150–400)
RBC: 3.88 MIL/uL — ABNORMAL LOW (ref 4.22–5.81)
RDW: 13.6 % (ref 11.5–15.5)
WBC: 7.1 10*3/uL (ref 4.0–10.5)
nRBC: 0 % (ref 0.0–0.2)

## 2024-04-03 LAB — ETHANOL: Alcohol, Ethyl (B): 356 mg/dL (ref <15)

## 2024-04-03 LAB — LIPASE, BLOOD: Lipase: 66 U/L — ABNORMAL HIGH (ref 11–51)

## 2024-04-03 MED ORDER — POTASSIUM CHLORIDE 20 MEQ PO PACK
60.0000 meq | PACK | ORAL | Status: AC
Start: 2024-04-03 — End: 2024-04-03
  Administered 2024-04-03: 60 meq via ORAL
  Filled 2024-04-03: qty 3

## 2024-04-03 NOTE — ED Provider Notes (Signed)
 [facility] Provider Note    Event Date/Time   First MD Initiated Contact with Patient 04/03/24 [phone]     (approximate)   History   Alcohol Intoxication   HPI  Maurice Murray is a 53 y.o. male past medical history significant for alcohol intoxication who presents to the emergency department intoxicated.  Patient states that he comes to the emergency department for alcohol intoxication.  States that he just does not feel well.  Complaining of a headache and abdominal cramping with some nausea.  Denies any falls or head trauma.  States that he is done really have in the past.  States his last drink of alcohol was 15 minutes prior to arrival.  On chart review patient was evaluated in the emergency department last night.  Patient was acutely intoxicated.  Plan for admission for alcoholic ketoacidosis however patient ultimately decided to leave AGAINST MEDICAL ADVICE.  Patient did not drive.  On my exam today patient is alert and oriented.  Answering all questions appropriately.  Ambulating around the emergency department without any difficulties.     Physical Exam   Triage Vital Signs: ED Triage Vitals  Encounter Vitals Group     BP 04/03/24 0633 (!) 123/97     Systolic BP Percentile --      Diastolic BP Percentile --      Pulse Rate 04/03/24 0633 (!) 110     Resp 04/03/24 0633 18     Temp 04/03/24 0633 98 F (36.7 C)     Temp Source 04/03/24 0633 Oral     SpO2 04/03/24 0633 96 %     Weight --      Height --      Head Circumference --      Peak Flow --      Pain Score 04/03/24 0635 0     Pain Loc --      Pain Education --      Exclude from Growth Chart --     Most recent vital signs: Vitals:   04/03/24 0633  BP: (!) 123/97  Pulse: (!) 110  Resp: 18  Temp: 98 F (36.7 C)  SpO2: 96%    Physical Exam Constitutional:      Appearance: He is well-developed.  HENT:     Head: Atraumatic.  Eyes:     Conjunctiva/sclera: Conjunctivae normal.   Cardiovascular:     Rate and Rhythm: Regular rhythm. Tachycardia present.  Pulmonary:     Effort: No respiratory distress.  Abdominal:     Tenderness: There is no abdominal tenderness.  Musculoskeletal:        General: Normal range of motion.     Cervical back: Normal range of motion.     Right lower leg: No edema.     Left lower leg: No edema.  Skin:    General: Skin is warm.     Capillary Refill: Capillary refill takes less than 2 seconds.  Neurological:     General: No focal deficit present.     Mental Status: He is alert and oriented to person, place, and time. Mental status is at baseline.     Gait: Gait normal.     IMPRESSION / MDM / ASSESSMENT AND PLAN / ED COURSE  I reviewed the triage vital signs and the nursing notes.  Differential diagnosis including alcoholic ketoacidosis, dehydration, electrolyte abnormality, secondary gain  Patient had an extensive workup done yesterday including CT scan of the head and CTA of  his chest that showed no signs of pulmonary embolism.  LABS (all labs ordered are listed, but only abnormal results are displayed) Labs interpreted as -    Labs Reviewed  ETHANOL - Abnormal; Notable for the following components:      Result Value   Alcohol, Ethyl (B) 356 (*)    All other components within normal limits  CBC WITH DIFFERENTIAL/PLATELET - Abnormal; Notable for the following components:   RBC 3.88 (*)    Hemoglobin 12.7 (*)    HCT 37.1 (*)    Platelets 105 (*)    All other components within normal limits  URINALYSIS, ROUTINE W REFLEX MICROSCOPIC - Abnormal; Notable for the following components:   Color, Urine YELLOW (*)    APPearance CLEAR (*)    Hgb urine dipstick MODERATE (*)    Protein, ur 30 (*)    All other components within normal limits  COMPREHENSIVE METABOLIC PANEL WITH GFR  LIPASE, BLOOD     MDM  Patient's alcohol significantly elevated today at 356.  Patient's alcohol consistently elevated in the 400s in the  emergency department.  Patient is alert and oriented.  Has a nonfocal neurologic exam.  Ambulating in the emergency department without any difficulties.  Patient had initial lab of lab work however after my evaluation patient eloped from the emergency department.  Was ambulating without any difficulties.  Have a very low concern for any intracranial hemorrhage.  No ketones in his urine have a low suspicion for alcoholic ketoacidosis.  Patient told triage nurse that he was going to get an [place] home.  Patient eloped from the emergency department.     PROCEDURES:  Critical Care performed: No  Procedures  Patient's presentation is most consistent with acute presentation with potential threat to life or bodily function.   MEDICATIONS ORDERED IN ED: Medications - No data to display  FINAL CLINICAL IMPRESSION(S) / ED DIAGNOSES   Final diagnoses:  Alcoholic intoxication without complication (HCC)     Rx / DC Orders   ED Discharge Orders     None        Note:  This document was prepared using Dragon voice recognition software and may include unintentional dictation errors.   Viviano Ground, MD 04/03/24 [phone]

## 2024-04-03 NOTE — ED Provider Notes (Signed)
 [facility] Provider Note    Event Date/Time   First MD Initiated Contact with Patient 04/03/24 2215     (approximate)   History   Chief Complaint: Alcohol Intoxication   HPI  Maurice Murray is a 53 y.o. male with a history of alcohol abuse and hypertension who comes to the ED reporting heavy drinking, last drink was 15 minutes prior to arrival.  Denies pain or vomiting or black or bloody stool.  No shortness of breath.  When asked why he came to the hospital after drinking, he reports that he was scared for his liver.        Past Medical History:  Diagnosis Date   Alcohol abuse    Anxiety    Cyst of skin 01/15/2023   Depression    GERD (gastroesophageal reflux disease)    Hypertension     Current Outpatient Rx   Order #: [id number] Class: Normal   Order #: [id number] Class: Normal   Order #: [id number] Class: Normal   Order #: [id number] Class: Normal   Order #: [id number] Class: Normal   Order #: [id number] Class: Normal   Order #: [id number] Class: Normal   Order #: [id number] Class: Normal   Order #: [id number] Class: Normal   Order #: [id number] Class: Normal   Order #: [id number] Class: Normal    Past Surgical History:  Procedure Laterality Date   GALLBLADDER SURGERY Right 2020   NECK SURGERY Left 01/30/2023   cyst removal    Physical Exam   Triage Vital Signs: ED Triage Vitals  Encounter Vitals Group     BP 04/03/24 2209 (!) 123/91     Systolic BP Percentile --      Diastolic BP Percentile --      Pulse Rate 04/03/24 2209 (!) 120     Resp 04/03/24 2209 16     Temp 04/03/24 2209 98 F (36.7 C)     Temp Source 04/03/24 2209 Oral     SpO2 04/03/24 2209 98 %     Weight --      Height 04/03/24 2210 6\' 1"  (1.854 m)     Head Circumference --      Peak Flow --      Pain Score 04/03/24 2210 0     Pain Loc --      Pain Education --      Exclude from Growth Chart --     Most recent vital signs: Vitals:   04/03/24 2209  BP: (!) 123/91   Pulse: (!) 120  Resp: 16  Temp: 98 F (36.7 C)  SpO2: 98%    General: Awake, no distress.  CV:  Good peripheral perfusion.  Tachycardia heart rate 110 Resp:  Normal effort.  Clear to auscultation bilaterally Abd:  No distention.  Soft nontender Other:  No signs of trauma.  No tremor or flushing or diaphoresis.  Normal mental status.  Clear speech, no dysarthria.  Steady gait.  Tolerating oral intake   ED Results / Procedures / Treatments   Labs (all labs ordered are listed, but only abnormal results are displayed) Labs Reviewed - No data to display   EKG    RADIOLOGY    PROCEDURES:  Procedures   MEDICATIONS ORDERED IN ED: Medications - No data to display   IMPRESSION / MDM / ASSESSMENT AND PLAN / ED COURSE  I reviewed the triage vital signs and the nursing notes.   Patient's presentation is most consistent with exacerbation of chronic illness.  Patient presents with heavy alcohol use  consistent with his usual alcohol abuse.  He is tachycardic which I think is more related to dehydration.  No other potential signs of alcohol withdrawal and he is still actively drinking.  He was seen in the ED earlier today, had labs drawn at 7:00 AM today which were reassuring.  No benefit to repeating them currently with lack of focal exam findings or new symptoms.  Potassium was a little bit low at 3.2, will supplement orally.  Appears to be clinically sober and stable for discharge.       FINAL CLINICAL IMPRESSION(S) / ED DIAGNOSES   Final diagnoses:  Alcohol abuse     Rx / DC Orders   ED Discharge Orders     None        Note:  This document was prepared using Dragon voice recognition software and may include unintentional dictation errors.   Jacquie Maudlin, MD 04/03/24 2225

## 2024-04-03 NOTE — ED Notes (Signed)
 Pt walked out to lobby, outside, then back into lobby. Triage RN offered to help pt walk back to his room. Pt stated "I need my bag of stuff!". Pt appeared agitated. Pt walked back to 19H by triage RN. Steady gait noted.

## 2024-04-03 NOTE — ED Notes (Signed)
 Pt is walking out again, pt is getting an uber. accompanied by security.

## 2024-04-03 NOTE — ED Triage Notes (Signed)
 Pt to ed from home via POV (uber) for ETOH intoxication. Pt was just here earlier for same. Pt is caox4, in no acute distress and ambulatory in triage. Pt denies SI/HI.

## 2024-04-03 NOTE — ED Notes (Signed)
 Patient seeking help with alcohol abuse.  Reports last drink approximately 15 minutes prior to arriving at the ED.

## 2024-04-03 NOTE — ED Triage Notes (Signed)
 Pt to ED POV from home stating, "I keep drinking, I'm struggling" and wants to stop drinking. Pt last drink was roughly 15 minutes ago but cannot say what or how much he has had. Pt is A&O x4 and calm in triage. Denies SI/HI

## 2024-04-05 ENCOUNTER — Other Ambulatory Visit: Payer: Self-pay

## 2024-04-05 ENCOUNTER — Emergency Department
Admission: EM | Admit: 2024-04-05 | Discharge: 2024-04-06 | Discharge status: Against Medical Advice | Payer: MEDICAID | Attending: Emergency Medicine | Admitting: Emergency Medicine

## 2024-04-05 ENCOUNTER — Encounter: Payer: Self-pay | Admitting: Emergency Medicine

## 2024-04-05 DIAGNOSIS — Y908 Blood alcohol level of 240 mg/100 ml or more: Secondary | ICD-10-CM | POA: Diagnosis not present

## 2024-04-05 DIAGNOSIS — F10120 Alcohol abuse with intoxication, uncomplicated: Secondary | ICD-10-CM | POA: Insufficient documentation

## 2024-04-05 DIAGNOSIS — Z5321 Procedure and treatment not carried out due to patient leaving prior to being seen by health care provider: Secondary | ICD-10-CM | POA: Insufficient documentation

## 2024-04-05 LAB — COMPREHENSIVE METABOLIC PANEL WITH GFR
ALT: 229 U/L — ABNORMAL HIGH (ref 0–44)
AST: 199 U/L — ABNORMAL HIGH (ref 15–41)
Albumin: 4.2 g/dL (ref 3.5–5.0)
Alkaline Phosphatase: 78 U/L (ref 38–126)
Anion gap: 14 (ref 5–15)
BUN: 11 mg/dL (ref 6–20)
CO2: 25 mmol/L (ref 22–32)
Calcium: 8.9 mg/dL (ref 8.9–10.3)
Chloride: 99 mmol/L (ref 98–111)
Creatinine, Ser: 0.55 mg/dL — ABNORMAL LOW (ref 0.61–1.24)
GFR, Estimated: >60 mL/min (ref >60)
Glucose, Bld: 137 mg/dL — ABNORMAL HIGH (ref 70–99)
Potassium: 3.5 mmol/L (ref 3.5–5.1)
Sodium: 138 mmol/L (ref 135–145)
Total Bilirubin: 0.9 mg/dL (ref 0.0–1.2)
Total Protein: 6.9 g/dL (ref 6.5–8.1)

## 2024-04-05 LAB — CBC WITH DIFFERENTIAL/PLATELET
Abs Immature Granulocytes: 0.01 10*3/uL (ref 0.00–0.07)
Basophils Absolute: 0.1 10*3/uL (ref 0.0–0.1)
Basophils Relative: 1 %
Eosinophils Absolute: 0.1 10*3/uL (ref 0.0–0.5)
Eosinophils Relative: 2 %
HCT: 39.7 % (ref 39.0–52.0)
Hemoglobin: 13.4 g/dL (ref 13.0–17.0)
Immature Granulocytes: 0 %
Lymphocytes Relative: 46 %
Lymphs Abs: 3.5 10*3/uL (ref 0.7–4.0)
MCH: 31.6 pg (ref 26.0–34.0)
MCHC: 33.8 g/dL (ref 30.0–36.0)
MCV: 93.6 fL (ref 80.0–100.0)
Monocytes Absolute: 0.3 10*3/uL (ref 0.1–1.0)
Monocytes Relative: 3 %
Neutro Abs: 3.6 10*3/uL (ref 1.7–7.7)
Neutrophils Relative %: 48 %
Platelets: 88 10*3/uL — ABNORMAL LOW (ref 150–400)
RBC: 4.24 MIL/uL (ref 4.22–5.81)
RDW: 13.9 % (ref 11.5–15.5)
WBC: 7.6 10*3/uL (ref 4.0–10.5)
nRBC: 0 % (ref 0.0–0.2)

## 2024-04-05 LAB — ETHANOL: Alcohol, Ethyl (B): 299 mg/dL — ABNORMAL HIGH (ref <15)

## 2024-04-05 NOTE — ED Notes (Signed)
 No answer when called several times from lobby

## 2024-04-05 NOTE — ED Triage Notes (Signed)
 Pt presents to the ED via POV with complaints of ETOH intoxication. Pt states he has had 20 beers today and last drink was 15 mins ago. Pt states he has checked in twice today and left because he "needed another beer". A&Ox4 at this time. Denies CP or SOB.

## 2024-04-06 ENCOUNTER — Encounter: Payer: Self-pay | Admitting: Urgent Care

## 2024-04-06 ENCOUNTER — Inpatient Hospital Stay
Admission: RE | Admit: 2024-04-06 | Discharge: 2024-04-06 | Disposition: A | Discharge status: Home / Self Care | Payer: MEDICAID | Source: Ambulatory Visit

## 2024-04-06 HISTORY — DX: Fatty (change of) liver, not elsewhere classified: K76.0

## 2024-04-06 NOTE — ED Notes (Signed)
 No answer when called several times from lobby

## 2024-04-06 NOTE — Pre-Procedure Instructions (Signed)
 During chart review for Pre Admit phone interview today I saw where pt was hospitalized here at [facility] 12/25-27 ETOH withdrawal and left AMA. He then was hospitalized at [facility] 1/4-7. Unclear if he has followed thru with tx program a. Then he was seen at Urgent care for flu symptom and later dx w/ Pneumonia. He has been seen ED 6 times from 4/30-5/5 for ETOH, some days twice in 1 day and has left AMA all of those trips. Forwarded this information to Laurey Poor at surgeons office. She forwarded info to Dr Ofilia Benton. They have scheduled an office visit for pt Thursday 04/08/24. It was requested we continue to try and contact pt to complete the Pre Admit process. Several attempts made have been unsuccessful. If pt arrives for 04/08/24 appt office will send pt to us  for pre op if still moving forward with surgery.

## 2024-04-08 ENCOUNTER — Telehealth: Payer: Self-pay | Admitting: Surgery

## 2024-04-08 ENCOUNTER — Ambulatory Visit: Payer: MEDICAID | Admitting: Surgery

## 2024-04-08 ENCOUNTER — Inpatient Hospital Stay: Admission: RE | Admit: 2024-04-08 | Payer: MEDICAID | Source: Ambulatory Visit

## 2024-04-08 HISTORY — DX: Unspecified convulsions: R56.9

## 2024-04-08 HISTORY — DX: Thrombocytopenia, unspecified: D69.6

## 2024-04-08 HISTORY — DX: Alcoholic liver disease, unspecified: K70.9

## 2024-04-08 HISTORY — DX: Unilateral inguinal hernia, without obstruction or gangrene, not specified as recurrent: K40.90

## 2024-04-08 HISTORY — DX: Essential (primary) hypertension: I10

## 2024-04-08 HISTORY — DX: Alcohol use, unspecified with withdrawal, unspecified: F10.939

## 2024-04-08 NOTE — Telephone Encounter (Signed)
 To date, patient has yet to call us  back regarding his surgery, nor has he called preadmissions back.  Patient had follow up with Dr. Ofilia Benton today (04/08/24) and no showed that visit after multiple attempts to remind him of follow up and surgery.  Per Dr. Ofilia Benton, surgery for 04/14/24 is cancelled.  If patient calls, he will need to follow up in office prior to rescheduling of surgery.

## 2024-04-08 NOTE — Pre-Procedure Instructions (Signed)
 No answer for pre-admission testing phone calls on 2 separate days. No show for Dr. Curlee Doss appointment. Surgery cancelled for May 14 due to non-compliance with appointments.

## 2024-04-14 ENCOUNTER — Emergency Department: Payer: MEDICAID

## 2024-04-14 ENCOUNTER — Encounter: Payer: Self-pay | Admitting: Emergency Medicine

## 2024-04-14 ENCOUNTER — Emergency Department
Admission: EM | Admit: 2024-04-14 | Discharge: 2024-04-14 | Disposition: A | Discharge status: Home / Self Care | Payer: MEDICAID | Attending: Emergency Medicine | Admitting: Emergency Medicine

## 2024-04-14 ENCOUNTER — Ambulatory Visit: Admit: 2024-04-14 | Payer: MEDICAID | Admitting: Surgery

## 2024-04-14 ENCOUNTER — Other Ambulatory Visit: Payer: Self-pay

## 2024-04-14 DIAGNOSIS — Y904 Blood alcohol level of 80-99 mg/100 ml: Secondary | ICD-10-CM | POA: Diagnosis not present

## 2024-04-14 DIAGNOSIS — F17210 Nicotine dependence, cigarettes, uncomplicated: Secondary | ICD-10-CM | POA: Diagnosis not present

## 2024-04-14 DIAGNOSIS — F10939 Alcohol use, unspecified with withdrawal, unspecified: Secondary | ICD-10-CM

## 2024-04-14 DIAGNOSIS — I10 Essential (primary) hypertension: Secondary | ICD-10-CM | POA: Diagnosis not present

## 2024-04-14 DIAGNOSIS — F10239 Alcohol dependence with withdrawal, unspecified: Secondary | ICD-10-CM | POA: Insufficient documentation

## 2024-04-14 DIAGNOSIS — Z79899 Other long term (current) drug therapy: Secondary | ICD-10-CM | POA: Insufficient documentation

## 2024-04-14 DIAGNOSIS — E876 Hypokalemia: Secondary | ICD-10-CM | POA: Diagnosis not present

## 2024-04-14 LAB — CBC
HCT: 37.4 % — ABNORMAL LOW (ref 39.0–52.0)
Hemoglobin: 12.9 g/dL — ABNORMAL LOW (ref 13.0–17.0)
MCH: 32.7 pg (ref 26.0–34.0)
MCHC: 34.5 g/dL (ref 30.0–36.0)
MCV: 94.7 fL (ref 80.0–100.0)
Platelets: 90 10*3/uL — ABNORMAL LOW (ref 150–400)
RBC: 3.95 MIL/uL — ABNORMAL LOW (ref 4.22–5.81)
RDW: 15.4 % (ref 11.5–15.5)
WBC: 4 10*3/uL (ref 4.0–10.5)
nRBC: 0 % (ref 0.0–0.2)

## 2024-04-14 LAB — COMPREHENSIVE METABOLIC PANEL WITH GFR
ALT: 112 U/L — ABNORMAL HIGH (ref 0–44)
AST: 138 U/L — ABNORMAL HIGH (ref 15–41)
Albumin: 4.1 g/dL (ref 3.5–5.0)
Alkaline Phosphatase: 61 U/L (ref 38–126)
Anion gap: 15 (ref 5–15)
BUN: 10 mg/dL (ref 6–20)
CO2: 22 mmol/L (ref 22–32)
Calcium: 8.8 mg/dL — ABNORMAL LOW (ref 8.9–10.3)
Chloride: 104 mmol/L (ref 98–111)
Creatinine, Ser: 0.57 mg/dL — ABNORMAL LOW (ref 0.61–1.24)
GFR, Estimated: >60 mL/min (ref >60)
Glucose, Bld: 97 mg/dL (ref 70–99)
Potassium: 3.3 mmol/L — ABNORMAL LOW (ref 3.5–5.1)
Sodium: 141 mmol/L (ref 135–145)
Total Bilirubin: 1.3 mg/dL — ABNORMAL HIGH (ref 0.0–1.2)
Total Protein: 7 g/dL (ref 6.5–8.1)

## 2024-04-14 LAB — ETHANOL: Alcohol, Ethyl (B): 81 mg/dL — ABNORMAL HIGH (ref <15)

## 2024-04-14 LAB — TROPONIN I (HIGH SENSITIVITY)
Troponin I (High Sensitivity): 11 ng/L (ref <18)
Troponin I (High Sensitivity): 12 ng/L (ref <18)

## 2024-04-14 LAB — LIPASE, BLOOD: Lipase: 45 U/L (ref 11–51)

## 2024-04-14 SURGERY — HERNIORRHAPHY, INGUINAL, ROBOT-ASSISTED, LAPAROSCOPIC
Anesthesia: General | Site: Inguinal | Laterality: Right

## 2024-04-14 MED ORDER — THIAMINE HCL 100 MG PO TABS
100.0000 mg | ORAL_TABLET | Freq: Every day | ORAL | Status: DC
  Administered 2024-04-14: 100 mg via ORAL
  Filled 2024-04-14 (×2): qty 1

## 2024-04-14 MED ORDER — POTASSIUM CHLORIDE CRYS ER 20 MEQ PO TBCR
40.0000 meq | EXTENDED_RELEASE_TABLET | Freq: Once | ORAL | Status: AC
  Administered 2024-04-14: 40 meq via ORAL
  Filled 2024-04-14: qty 2

## 2024-04-14 MED ORDER — LORAZEPAM 2 MG/ML IJ SOLN
0.0000 mg | Freq: Four times a day (QID) | INTRAMUSCULAR | Status: DC
  Administered 2024-04-14 (×2): 2 mg via INTRAVENOUS
  Filled 2024-04-14 (×2): qty 1

## 2024-04-14 MED ORDER — SODIUM CHLORIDE 0.9 % IV BOLUS
1000.0000 mL | Freq: Once | INTRAVENOUS | Status: AC
  Administered 2024-04-14: 1000 mL via INTRAVENOUS

## 2024-04-14 MED ORDER — LORAZEPAM 2 MG PO TABS
2.0000 mg | ORAL_TABLET | Freq: Once | ORAL | Status: AC
  Administered 2024-04-14: 2 mg via ORAL
  Filled 2024-04-14: qty 1

## 2024-04-14 NOTE — ED Notes (Signed)
 Father bringing pt straight to [facility].

## 2024-04-14 NOTE — ED Notes (Signed)
 Pt called dad and he is on the way.

## 2024-04-14 NOTE — ED Provider Notes (Signed)
 Emergency department handoff note  Care of this patient was signed out to me at the end of the previous provider shift.  All pertinent patient information was conveyed and all questions were answered.  Patient pending metabolization of alcohol.  Patient is p.o. tolerant and ambulatory without difficulty.  Patient has a safe ride home. The patient has been reexamined and is ready to be discharged.  All diagnostic results have been reviewed and discussed with the patient/family.  Care plan has been outlined and the patient/family understands all current diagnoses, results, and treatment plans.  There are no new complaints, changes, or physical findings at this time.  All questions have been addressed and answered.  Patient was instructed to, and agrees to follow-up with their primary care physician as well as return to the emergency department if any new or worsening symptoms develop.   Madden Garron K, MD 04/14/24 1025

## 2024-04-14 NOTE — ED Triage Notes (Signed)
 Pt via [facility] from [place]. Pt c/o L sided CP, nausea, and SOB states it started 3 days, Pt has a hx of alcoholism, rteports relapsing 2 weeks ago after 6 months of sobtriety. Pt has been drinking 1 case a beer for for the past 2 weeks and last drink 7:00pm last night. States pt has a hx of alcohol withdrawal with seizures. On arrival, pt has obvious tremors and is anxious. Pt is A&Ox4 and NAD. EMS reports:  130 HR  156/78 BP  99% on RA

## 2024-04-14 NOTE — ED Provider Notes (Signed)
 [facility] Provider Note    Event Date/Time   First MD Initiated Contact with Patient 04/14/24 [phone]     (approximate)   History   Alcohol withdrawal   HPI  Maurice Murray is a 52 y.o. male brought to the ED via EMS from home with a chief complaint of alcohol withdrawal.  Patient reports he has been on a binge for the last 10 to 14 days, drinking mainly beer.  Last drink approximately 7 PM.  Feeling like he is either going to have a heart attack or seizure.  History of DTs.  Denies associated fever/chills, shortness of breath, abdominal pain, nausea/vomiting or dizziness.     Past Medical History   Past Medical History:  Diagnosis Date   Alcohol abuse    Alcohol withdrawal seizure (HCC)    Alcoholic liver disease (HCC)    Anxiety    Community acquired bacterial pneumonia 01/2024   Cyst of skin 01/15/2023   Depression    Essential hypertension    GERD (gastroesophageal reflux disease)    Hepatic steatosis    Right inguinal hernia    Thrombocytopenia (HCC)      Active Problem List   Patient Active Problem List   Diagnosis Date Noted   Alcohol withdrawal (HCC) 04/01/2024   Positive colorectal cancer screening using Cologuard test 02/02/2024   Acute cough 02/02/2024   Anxiety and depression 01/05/2024   Tobacco dependence due to cigarettes 01/05/2024   Unilateral inguinal hernia without obstruction or gangrene 01/05/2024   Alcohol use disorder 01/05/2024   Hepatic steatosis 11/27/2023   Thrombocytopenia (HCC) 11/27/2023   GERD (gastroesophageal reflux disease) 11/27/2023   Acute hyperactive alcohol withdrawal delirium (HCC) 11/26/2023   Alcoholic liver disease (HCC) 11/26/2023   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 Authorizing Provider  albuterol  (VENTOLIN  HFA) 108 (90 Base) MCG/ACT inhaler Inhale 2 puffs into the lungs every 6 (six) hours as needed for wheezing or shortness of breath. 02/03/24   Tasia Farr, FNP  amLODipine  (NORVASC ) 10 MG tablet Take 1 tablet (10 mg total) by mouth daily. 01/05/24   Clifton, Kellie A, FNP  chlordiazePOXIDE  (LIBRIUM ) 25 MG capsule Take 1-2 capsules (25-50 mg total) by mouth 3 (three) times daily as needed for withdrawal. 04/01/24   Lind Repine, MD  cyanocobalamin  (VITAMIN B12) 500 MCG tablet Take 1 tablet (500 mcg total) by mouth daily. 05/22/23   Iloabachie, Chioma E, NP  esomeprazole  (NEXIUM ) 20 MG capsule Take 1 capsule (20 mg total) by mouth daily. 02/09/24   Clifton, Kellie A, FNP  ibuprofen  (ADVIL ) 600 MG tablet Take 1 tablet (600 mg total) by mouth in the morning, evening, and before bedtime. Do all this for 10 days. 02/18/24     Multiple Vitamin (MULTIVITAMIN WITH MINERALS) TABS tablet Take 1 tablet by mouth daily. 03/09/24   Carlean Charter, DO  naltrexone  (DEPADE) 50 MG tablet Take 1 tablet (50 mg total) by mouth daily. 03/09/24  Carlean Charter, DO  ondansetron  (ZOFRAN -ODT) 4 MG disintegrating tablet Take 1 tablet (4 mg total) by mouth every 8 (eight) hours as needed. 01/28/24   Arline Bennett, MD  promethazine -dextromethorphan (PROMETHAZINE -DM) 6.25-15 MG/5ML syrup Take 2.5 mLs by mouth 4 (four) times daily as needed for cough. 02/02/24   Clifton, Kellie A, FNP  sertraline  (ZOLOFT ) 100 MG tablet Take 1 tablet (100 mg total) by mouth daily. 01/05/24   Clifton, Kellie A, FNP     Allergies  Patient has no known allergies.   Family History   Family History  Problem Relation Age of Onset    Alcohol abuse Mother    Cirrhosis Mother    Healthy Father    Crohn's disease Sister    Healthy Daughter    Alcohol abuse Maternal Grandmother    Hypertension Maternal Grandfather    Heart disease Maternal Grandfather    Lung cancer Paternal Grandmother        small cell   Hypertension Paternal Grandfather    Heart attack Paternal Grandfather 80     Physical Exam  Triage Vital Signs: ED Triage Vitals  Encounter Vitals Group     BP      Systolic BP Percentile      Diastolic BP Percentile      Pulse      Resp      Temp      Temp src      SpO2      Weight      Height      Head Circumference      Peak Flow      Pain Score      Pain Loc      Pain Education      Exclude from Growth Chart     Updated Vital Signs: BP (!) 135/97   Pulse (!) 125   Temp 99.3 F (37.4 C) (Oral)   Resp 18   Ht 6\' 1"  (1.854 m)   Wt 88.5 kg   SpO2 97%   BMI 25.73 kg/m    General: Awake, mild to moderate distress.  CV:  Tachycardic.  Good peripheral perfusion.  Resp:  Normal effort.  CTAB. Abd:  Nontender.  No distention.  Other:  Tremors noted bilateral hands.   ED Results / Procedures / Treatments  Labs (all labs ordered are listed, but only abnormal results are displayed) Labs Reviewed  CBC - Abnormal; Notable for the following components:      Result Value   RBC 3.95 (*)    Hemoglobin 12.9 (*)    HCT 37.4 (*)    Platelets 90 (*)    All other components within normal limits  COMPREHENSIVE METABOLIC PANEL WITH GFR - Abnormal; Notable for the following components:   Potassium 3.3 (*)    Creatinine, Ser 0.57 (*)    Calcium 8.8 (*)    AST 138 (*)    ALT 112 (*)    Total Bilirubin 1.3 (*)    All other components within normal limits  LIPASE, BLOOD  ETHANOL  URINE DRUG SCREEN, QUALITATIVE ([facility] ONLY)  TROPONIN I (HIGH SENSITIVITY)     EKG  ED ECG REPORT I, Kaiyla Stahly J, the attending physician, personally viewed and interpreted this ECG.   Date: 04/14/2024  EKG  Time: 0608  Rate: 121  Rhythm: sinus tachycardia  Axis: Normal  Intervals:right bundle branch block  ST&T Change: Nonspecific    RADIOLOGY I have independently visualized and interpreted patient's imaging study as  well as noted the radiology interpretation:  Chest x-ray: No acute cardiopulmonary process  Official radiology report(s): DG Chest Port 1 View Result Date: 04/14/2024 CLINICAL DATA:  Chest pain. EXAM: PORTABLE CHEST 1 VIEW COMPARISON:  04/02/2024 FINDINGS: The lungs are clear without focal pneumonia, edema, pneumothorax or pleural effusion. The cardiopericardial silhouette is within normal limits for size. No acute bony abnormality. Telemetry leads overlie the chest. IMPRESSION: No active disease. Electronically Signed   By: Donnal Fusi M.D.   On: 04/14/2024 06:49     PROCEDURES:  Critical Care performed: No  .1-3 Lead EKG Interpretation  Performed by: Norlene Beavers, MD Authorized by: Norlene Beavers, MD     Interpretation: abnormal     ECG rate:  121   ECG rate assessment: tachycardic     Rhythm: sinus tachycardia     Ectopy: none     Conduction: normal   Comments:     Patient placed on cardiac monitor to evaluate for arrhythmias    MEDICATIONS ORDERED IN ED: Medications  LORazepam  (ATIVAN ) injection 0-4 mg (2 mg Intravenous Given 04/14/24 0623)  thiamine  (VITAMIN B1) tablet 100 mg (has no administration in time range)  potassium chloride  SA (KLOR-CON  M) CR tablet 40 mEq (has no administration in time range)  sodium chloride  0.9 % bolus 1,000 mL (1,000 mLs Intravenous New Bag/Given 04/14/24 0624)     IMPRESSION / MDM / ASSESSMENT AND PLAN / ED COURSE  I reviewed the triage vital signs and the nursing notes.                             53 year old male with a history of alcohol dependency presenting with chest pain, shaky feeling after last drink at 7 PM.  Differential diagnosis includes but is not limited to alcohol withdrawal, delirium, metabolic,  infectious etiologies, etc.  I have personally reviewed patient's records and note multiple ED visits for alcohol intoxication, last admission for alcohol withdrawal 03/31/2024.  Patient's presentation is most consistent with acute complicated illness / injury requiring diagnostic workup.  The patient is on the cardiac monitor to evaluate for evidence of arrhythmia and/or significant heart rate changes.  Will obtain lab work, EKG.  Initiate IV fluid hydration, place on CIWA and reassess.  Clinical Course as of 04/14/24 0653  Wed Apr 14, 2024  0652 Laboratory results demonstrate stable thrombocytopenia, mild hypokalemia, mildly elevated transaminases and bilirubin which is stable.  Will replete potassium orally.  IV fluids infusing, remains on CIWA.  Care will be transferred to the oncoming provider pending reassessment. [JS]    Clinical Course User Index [JS] Norlene Beavers, MD     FINAL CLINICAL IMPRESSION(S) / ED DIAGNOSES   Final diagnoses:  Alcohol withdrawal syndrome with complication (HCC)  Hypokalemia     Rx / DC Orders   ED Discharge Orders     None        Note:  This document was prepared using Dragon voice recognition software and may include unintentional dictation errors.   Maxen Rowland J, MD 04/14/24 [phone]

## 2024-04-14 NOTE — ED Notes (Signed)
 Obtained order for ativan  (IV was already removed). Pt has severe hand tremor.

## 2024-06-17 ENCOUNTER — Encounter: Payer: Self-pay | Admitting: Family Medicine

## 2024-06-17 ENCOUNTER — Other Ambulatory Visit: Payer: Self-pay

## 2024-06-18 ENCOUNTER — Other Ambulatory Visit: Payer: Self-pay | Admitting: Family Medicine

## 2024-06-18 DIAGNOSIS — F32A Depression, unspecified: Secondary | ICD-10-CM

## 2024-06-18 DIAGNOSIS — I10 Essential (primary) hypertension: Secondary | ICD-10-CM

## 2024-06-18 DIAGNOSIS — Z8719 Personal history of other diseases of the digestive system: Secondary | ICD-10-CM

## 2024-06-18 MED ORDER — ESOMEPRAZOLE MAGNESIUM 20 MG PO CPDR: 20.0000 mg | DELAYED_RELEASE_CAPSULE | Freq: Every day | ORAL | 0 refills | Status: AC

## 2024-06-18 MED ORDER — SERTRALINE HCL 100 MG PO TABS: 100.0000 mg | ORAL_TABLET | Freq: Every day | ORAL | 0 refills | Status: AC

## 2024-06-18 MED ORDER — AMLODIPINE BESYLATE 10 MG PO TABS
10.0000 mg | ORAL_TABLET | Freq: Every day | ORAL | 0 refills | Status: AC
Start: 2024-06-18 — End: ?

## 2024-06-18 NOTE — Telephone Encounter (Signed)
Please see the pt message below

## 2024-07-19 NOTE — Progress Notes (Signed)
 Case Management Interim Assessment Note Patient Information: DOB:1971/07/31 Gender:male Admission Date: 07/15/2024 Primary Care Provider: No Pcp Preferred Pharmacy: [id number], [place] - [address] AT [facility] [address] - PHONE: [phone] - FAX: [phone]   Unplanned Readmission Score:  11.29  Discharge Barriers    Narrative Anticipated Discharge Plan: Patient is NMR for dc, patient's ADOD 08/19. Patient is on IV abx to tx his cellulitis. Pt wants to dc to inpt substance use treatment center, resources have been given.    Anticipated Discharge Location: Other (see comment) Alternative Discharge Plan:Substance use tx.  Post-Acute Communication: Substance use tx.     Any concerns with discharge plan: No concerns verbalized with current anticipated discharge plan Date: Mon 07/19/2024  Patient's level of function on discharge is expected to see discharge plan comments.   Discharge Disposition: Home or Self Care  Assessment Completed by: Belen Shutter, MSW

## 2024-07-20 NOTE — Discharge Summary (Signed)
 Hospital Medicine Discharge Summary   Demographics: Maurice Murray  53 y.o. 1971-04-10 MRN: [id number]    Extended Emergency Contact Information Primary Emergency Contact: Abate Emmit Raddle. Home Phone: [phone] Relation: Father  Full Code  Admit Date: 07/15/2024                            Attending Physician: Shirlyn Von Ped, MD Discharge Date: 07/20/2024  Primary Care Provider: No Pcp   None  Consults during this admission: Consult Orders             IP CONSULT TO UROLOGY       Specialty:  Urology  Provider:  (Not yet assigned)              Active & Resolved Diagnosis: Principal Problem:   Cellulitis of scrotum Active Problems:   Alcohol use disorder, severe, dependence (HCC)   Essential hypertension   Alcoholic cirrhosis (HCC)   Anxiety and depression   Alcoholic ketoacidosis   Alcohol use with withdrawal without complication    (CMD)   Smoking addiction   Precordial pain   Lactic acidosis Resolved Problems:   * No resolved hospital problems. *   Disposition: Patient discharged to Home in fair condition.   Discharge follow-up recommendations : Other:   Follow-up with the PCP Follow-up with GI/hepatobiliary regarding hepatic steatosis/early morphologic changes suggestive of cirrhosis on ultrasound Follow-up with urology Follow-up with general surgery   Hospital Course: 53 year old male with a history of hypertension, and major depressive disorder, alcohol use admitted to [facility] on 07/15/2024 for evaluation and management of alcohol intoxication and scrotal pain. The patient reported consuming approximately 15 beers on the day of admission and experienced multiple episodes of vomiting, as well as right scrotal pain. He had been admitted the previous day for SIRS, lactic acidosis, and alcoholic ketoacidosis, but left the hospital against medical advice. On re-presentation, he was alert and ambulatory but mildly tachycardic. Physical  examination revealed a right inguinal hernia with extension into the scrotum and diffuse scrotal erythema, without evidence of necrotizing infection or purulent drainage. Laboratory studies were notable for persistent leukocytosis, mild lactic acidosis, elevated liver enzymes, and mild hyperbilirubinemia. Imaging included a normal testicular ultrasound and a CT abdomen/pelvis showing a right inguinal hernia without obstruction or strangulation. The patient developed tremors and worsening tachycardia in the ED, which improved with IV diazepam. He was diagnosed with scrotal cellulitis, and SIRS. Broad-spectrum IV antibiotics (cefepime and vancomycin) were initiated, and urology was consulted, recommending continued medical management. The patient was monitored on the CIWA protocol and received supportive care for alcohol withdrawal, including thiamine , folic acid, multivitamins, and nicotine replacement. Over the course of hospitalization, his leukocytosis and inflammatory markers improved, and his scrotal erythema and pain gradually resolved. He remained hemodynamically stable, without fever or recurrent withdrawal symptoms. The patient expressed interest in alcohol rehabilitation and case management was involved to facilitate placement upon discharge. He was discharged in stable condition with plans for outpatient follow-up with urology and hepatology, and to proceed to alcohol rehabilitation.                Wound / Incision Assessment: Refer to Chart Review and Media Tab for images if available.      Temp:  [97.4 F (36.3 C)-98.4 F (36.9 C)] 98.3 F (36.8 C) Heart Rate:  [56-91] 91 Resp:  [16-19] 18 BP: (112-131)/(72-85) 131/84      Discharge Medications  New Medications      Sig Disp Refill Start End  cephALEXin 500 mg capsule Commonly known as: KEFLEX  Take 1 capsule (500 mg total) by mouth every 6 (six) hours for 6 days.  24 capsule  0     folic acid 1 mg  tablet Commonly known as: FOLVITE  Take 1 tablet (1 mg total) by mouth daily.  90 tablet  0     multivitamin with minerals  Take 1 tablet by mouth daily.  90 tablet  0  July 21, 2024    omeprazole  20 mg DR capsule Commonly known as: PriLOSEC Replaces: pantoprazole  40 mg EC tablet  Take 1 capsule (20 mg total) by mouth in the morning.  90 capsule  0  July 21, 2024    thiamine  50 mg Tab tablet Commonly known as: VITAMIN B1  Take 1 tablet (50 mg total) by mouth daily.  30 tablet  0         Modified Medications      Sig Disp Refill Start End  amLODIPine  10 mg tablet Commonly known as: NORVASC  What changed: Another medication with the same name was removed. Continue taking this medication, and follow the directions you see here.  Take 10 mg by mouth daily.   0     sertraline  100 mg tablet Commonly known as: ZOLOFT  What changed:  medication strength how much to take Another medication with the same name was removed. Continue taking this medication, and follow the directions you see here.  Take 1 tablet (100 mg total) by mouth daily.  90 tablet  0  July 21, 2024        Medications To Continue      Sig Disp Refill Start End  albuterol  HFA 90 mcg/actuation inhaler Commonly known as: PROVENTIL  HFA;VENTOLIN  HFA;PROAIR  HFA  Inhale 2 puffs every 6 (six) hours as needed.   0     esomeprazole  20 mg DR capsule Commonly known as: NexIUM   Take 20 mg by mouth daily.   0         Stopped Medications    pantoprazole  40 mg EC tablet Commonly known as: PROTONIX  Replaced by: omeprazole  20 mg DR capsule   PHENobarbitaL  32.4 mg tablet   VivitroL  380 mg Serr Generic drug: naltrexone  microspheres       Discharge Orders     Ambulatory referral to Gastroenterology     Details:    Reason for Referral:  General GI Consult Hepatology Consult     Comments: Ultrasound of the early morphological changes suggestive of cirrhosis presumed secondary to alcohol  use   Lifting Limits:     Details:    Lifting Limits: No lifting limits   Regular diet     Details:    Diet type: Regular   Ambulatory referral to PCP     Ambulatory referral to Urology     Details:    Reason for Referral: Urology   Reason for Referral: Other   Please provide information: Scrotal cellulitis   Ambulatory referral to General Surgery     Details:    Reason for Referral: General Surgery   Comments: Inguinal hernia         Lab Results  Component Value Date/Time   HGB 12.2 07/20/2024 03:51 AM   HCT 35 (L) 07/20/2024 03:51 AM   WBC 7.20 07/20/2024 03:51 AM   PLT 116 (L) 07/20/2024 03:51 AM   Lab Results  Component Value Date/Time   NA 139 07/20/2024  03:51 AM   K 3.5 07/20/2024 03:51 AM   CREATININE 0.45 (L) 07/20/2024 03:51 AM   BUN 9 07/20/2024 03:51 AM   GLUCOSE 91 07/20/2024 03:51 AM    Pertinent Imaging: Transthoracic echo (TTE) complete  Final Result by Ruel Flash, MD (08/15 1408)                                                                                                                                                                                                                                                  Version: 1                                                                                                                                                                                                                                       Study ID: [id number]  [facility]                                                                                                                   [address]                                                                                                                         [place], [place]                                                                                                                    Postal Code: [address]    +--------------------------------------------------------------------------  ---------------------------------------------------------------------------  ---------------------------------------------------------------------------  --------------------------------------------------------------+  :  Name:    Maurice Murray                                                                                 Study Date:    07/16/2024, 11:13 AM                                                                                                                                                 :  :  DOB:   Jul 21, 1971                                                                                             MRN:    [id number]                                                                                                                                                                    :  :  Age:  89 Years                                                                                                Patient Class:   Inpatient                                                                                                                                                          :  :  Birth Gender:    Male  Patient Location:    [facility]                                                                                                                                                :  BETHA Feathers:       [id number]                                                                               Accession number:      [id number]                                                                                                                                                :  :  Alternate ID:   [id number]                                                                                      OrderNumber:      [id number]                                                                                                                                                        :  :  BP:  141 / 84 mmHg                                                                                             Height:  185.4 cm                                             BSA:  2.07 m2                                                                                                        :  :  HR:  82 bpm                                                                                                    Weight:   83 kg                                               BMI:  24.1 kilograms/m2                                                                                              :  :  Ordering Physician:    HUEL ENOS RICK                                                                      Performed By:     Waddell Founds,   RDCS                                                                                                                                          :  :  Referring Physician:    HUEL NEEDLES RAJ                                                                                                                                                                                                                                                        :  :  Reason For Study:     SOB, abn biomarkers, heart failure suspected                                                                                                                                                                                                                                :  +--------------------------------------------------------------------------   ---------------------------------------------------------------------------  ---------------------------------------------------------------------------  --------------------------------------------------------------+  Procedure  A complete echocardiogram was performed including 2D, M-mode, spectral   Doppler, color Doppler, and UEA. During the exam, .3 ml of ultrasound   enhancing agent, Definity, was administered by the sonographer. Study   quality: suboptimal. The study was technically limited due to: limited   windows,  restricted mobility.  Conclusions  The left ventricular systolic function is normal with LVEF of 55% (+/-5%)   by visual assessment.  Right ventricular systolic function is normal by visual assessment.  No segmental/regional wall motion abnormalities identified.  LV diastolic function cannot be determined due to insufficient data.  No significant valve stenosis or regurgitation.    Left Ventricle  The left ventricular cavity size is normal with normal wall thickness. The   left ventricular systolic function is normal with LVEF of 55% (+/-5%) by   visual assessment. No segmental/regional wall motion abnormalities   identified. LV diastolic function cannot be determined due to insufficient   data.    Right Ventricle  The right ventricle is not well visualized. The right ventricular size is   mildly dilated by visual assessment. Right ventricular systolic function   is normal by visual assessment. Right ventricular systolic pressure could   not be measured secondary to minimal or no tricuspid regurgitation.    Left Atrium  The left atrium is normal in size by 2D measurement or qualitative   assessment.    Right Atrium  The right atrium is normal in size by qualitative assessment.    Tricuspid Valve  The tricuspid valve is normal in appearance with no tricuspid   regurgitation and no stenosis.     Mitral Valve  The mitral valve is normal in appearance with no mitral regurgitation and   no mitral stenosis.    Aortic Valve  The aortic valve is not well visualized. The aortic valve leaflets are   normal with no aortic stenosis and no aortic regurgitation.    Pulmonic Valve  The pulmonic valve is not well visualized.    Interatrial Septum  The interatrial septum is normal in appearance with no evidence of atrial   septal defect or patent foramen ovale by color Doppler.    IVC/Hepatic  The inferior vena cava was not visualized.    Aorta  Aortic root diam: 4.0 cm. Ascending aorta diam: 3.8 cm.    Pericardial/Pleural  There is no pericardial effusion.                                                          Left Ventricle  IVSd: 0.8 cm                                                                                                                                                         LV mass(C)d: 169.6 grams  LVIDd: 5.3 cm  LV mass(C)d Index: 81.9 grams/m2  LVIDs: 3.6 cm  LVPWd: 0.9 cm  RWT: 0.35  LV e', Medial: 7.6 cm/sec                                                         LV E/e' Lateral: 4.8                                                              MV E/A: 0.7  LV e', Lateral: 10.1 cm/sec                                                        LV E/e' Medial: 6.4                                                              MV Peak E Vel: 48.4 cm/sec  LV e' Avg: 8.9 cm/sec                                                             LV E/e' (Average): 5.6                                                           MV Peak A Vel: 73.3 cm/sec                                                          Right Ventricle                                                                                         RV S Vel_phl: 8.9  [9.6 - 99.0] cm/sec  TAPSE: 2.30 cm                                                          Atria  LA Dim: 3.6 cm                                                          Mitral Valve  MV E/A: 0.7                                                                      MV dec time: 228.9 msec  MV Peak E Vel: 48.4 cm/sec  MV Peak A Vel: 73.3 cm/sec                                                          Aorta  Asc Ao Diam: 3.8 cm                                                             AoR Diam: 4.0 cm    ___________________________________________________________________________  ___________________________________________________________________________  ___________________________________________________________________________  _________________  Electronically signed by: Ruel Flash, MD  on: 07/16/2024, 2: 08 PM                                                                                                                                                                               ProcedureCode: [id number] ECHO (TTE)   COMPLETE        US  Abdomen Limited  Final Result by Adolm Munster, MD [phone])  DATE OF SERVICE:  07/16/2024 3:26 am    EXAM:  LIMITED ABDOMINAL ULTRASOUND - RIGHT UPPER QUADRANT    CLINICAL HISTORY:  ? Cirrhosis    COMPARISON:  No Comparison.    TECHNIQUE:  Real-time gray-scale examination with static image captures was performed.    FINDINGS:    LIVER: Coarsened, echogenic parenchyma mildly nodular serosal contour.   Acoustic penetration and posterior parenchymal evaluation is limited.   Right lobe is enlarged up to 19-20 cm in length.    MAIN PORTAL AND MIDDLE HEPATIC VEINS: Patent with normal antegrade flow.    GALLBLADDER: Cholecystectomy.    CBD: 8. Mm diameter.    PANCREAS: Unremarkable to the extent visualized.    RIGHT  KIDNEY: No hydronephrosis or identified renal calculi. Normal   cortical thickness and echogenicity. The kidney measures 13.0 cm in   length.    IVC:  Patent and normal direction of flow in the intrahepatic segment.    FREE FLUID: None.    OTHER FINDINGS: None.    IMPRESSION:  1. Hepatic steatosis and suspected early cirrhotic morphologic changes.  2. Post cholecystectomy.      THIS IS AN ELECTRONICALLY VERIFIED FINAL REPORT  07/16/2024 3:41 AM - Electronically signed by Adolm Munster   Workstation: [id number]  [facility]    US  Testicular With Doppler  Final Result by Francis KATHEE Kidney, MD (08/14 2244)  DATE OF SERVICE:  07/15/2024 10:08 pm    EXAM:  US  TESTICULAR WITH DOPPLER    CLINICAL HISTORY:  testicular pain.    COMPARISON:  No Comparison.    TECHNIQUE:  Real-time Duplex scan was performed using B-Mode/gray scale imaging, color   flow and spectral Doppler analysis to include bilateral evaluation of   testicular arterial and venous flow were performed.    FINDINGS:  TESTES: Homogenous in echo pattern without evidence of an intratesticular   mass lesion. Normal arterial and venous Doppler waveforms are present   bilaterally; symmetric color flow is also depicted. The right and left   testicle measures about 3.9 x 2.8 x 1.6 cm and 3.8 x 2.8 x 1.7 cm   respectively.    EPIDIDYMAL GLANDS: Unremarkable.    VARICOCELE: None.    HYDROCELE: Nothing significant. Small physiologic amount noted   bilaterally.    OTHER FINDINGS: None.    IMPRESSION:  Normal testicular ultrasound.    THIS IS AN ELECTRONICALLY VERIFIED FINAL REPORT  07/15/2024 10:44 PM - Electronically signed by Francis Kidney   Workstation: [id number]  [facility]      Electronically signed by: Anil Kumar Savarapu, MD 07/20/2024 2:52 PM   Time spent on discharge: 35 minutes

## 2024-07-21 ENCOUNTER — Telehealth: Payer: Self-pay

## 2024-07-21 NOTE — Transitions of Care (Post Inpatient/ED Visit) (Signed)
   07/21/2024  Name: Maurice Murray MRN: [id number] DOB: [birth year]  Today's TOC FU Call Status: Today's TOC FU Call Status:: Unsuccessful Call (1st Attempt) Unsuccessful Call (1st Attempt) Date: 07/21/24  Attempted to reach the patient regarding the most recent Inpatient/ED visit.  Follow Up Plan: Additional outreach attempts will be made to reach the patient to complete the Transitions of Care (Post Inpatient/ED visit) call.   Richerd Fish, RN, BSN, CCM [facility], [facility] RN Care Manager Direct Dial: [phone]

## 2024-07-22 ENCOUNTER — Telehealth: Payer: Self-pay

## 2024-07-22 NOTE — Transitions of Care (Post Inpatient/ED Visit) (Signed)
   07/22/2024  Name: Maurice Murray MRN: [id number] DOB: 03/23/1971  Today's TOC FU Call Status: Today's TOC FU Call Status:: Unsuccessful Call (3rd Attempt) Unsuccessful Call (2nd Attempt) Date: 07/22/24 Unsuccessful Call (3rd Attempt) Date: 07/22/24 (placed call to fathers phone number ( on the DPR) and requested a call back. Father to give patient a message.)  Attempted to reach the patient regarding the most recent Inpatient/ED visit.  Follow Up Plan: No further outreach attempts will be made at this time. We have been unable to contact the patient.  Alan Ee, RN, BSN, CEN [employer]- Transition of Care Team.  [facility] [phone]

## 2024-12-29 ENCOUNTER — Other Ambulatory Visit: Payer: Self-pay

## 2025-01-18 ENCOUNTER — Ambulatory Visit: Payer: Self-pay | Admitting: Gerontology
